# Patient Record
Sex: Female | Born: 1990 | Race: White | Hispanic: No | Marital: Married | Smoking: Never smoker
Health system: Southern US, Community
[De-identification: ages and names within clinical notes are randomized; demographics above are authoritative.]

## PROBLEM LIST (undated history)

## (undated) DIAGNOSIS — R011 Cardiac murmur, unspecified: Secondary | ICD-10-CM

## (undated) DIAGNOSIS — Z8619 Personal history of other infectious and parasitic diseases: Secondary | ICD-10-CM

## (undated) DIAGNOSIS — K5909 Other constipation: Secondary | ICD-10-CM

## (undated) DIAGNOSIS — R55 Syncope and collapse: Secondary | ICD-10-CM

## (undated) DIAGNOSIS — R87629 Unspecified abnormal cytological findings in specimens from vagina: Secondary | ICD-10-CM

## (undated) DIAGNOSIS — K589 Irritable bowel syndrome without diarrhea: Secondary | ICD-10-CM

## (undated) HISTORY — DX: Irritable bowel syndrome, unspecified: K58.9

## (undated) HISTORY — PX: WISDOM TOOTH EXTRACTION: SHX21

## (undated) HISTORY — DX: Syncope and collapse: R55

## (undated) HISTORY — DX: Personal history of other infectious and parasitic diseases: Z86.19

## (undated) HISTORY — DX: Other constipation: K59.09

---
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
## 2018-07-15 LAB — HM PAP SMEAR: HM Pap smear: ABNORMAL

## 2018-10-20 ENCOUNTER — Ambulatory Visit: Payer: Self-pay | Admitting: Physician Assistant

## 2019-04-22 ENCOUNTER — Emergency Department: Payer: Federal, State, Local not specified - PPO

## 2019-04-22 ENCOUNTER — Encounter: Payer: Self-pay | Admitting: Emergency Medicine

## 2019-04-22 ENCOUNTER — Other Ambulatory Visit: Payer: Self-pay

## 2019-04-22 ENCOUNTER — Emergency Department
Admission: EM | Admit: 2019-04-22 | Discharge: 2019-04-22 | Disposition: A | Discharge status: Home / Self Care | Payer: Federal, State, Local not specified - PPO | Attending: Emergency Medicine | Admitting: Emergency Medicine

## 2019-04-22 DIAGNOSIS — R1013 Epigastric pain: Secondary | ICD-10-CM

## 2019-04-22 DIAGNOSIS — R1011 Right upper quadrant pain: Secondary | ICD-10-CM | POA: Diagnosis not present

## 2019-04-22 DIAGNOSIS — Z3202 Encounter for pregnancy test, result negative: Secondary | ICD-10-CM | POA: Insufficient documentation

## 2019-04-22 LAB — COMPREHENSIVE METABOLIC PANEL
ALT: 15 U/L (ref 0–44)
AST: 16 U/L (ref 15–41)
Albumin: 5.1 g/dL — ABNORMAL HIGH (ref 3.5–5.0)
Alkaline Phosphatase: 72 U/L (ref 38–126)
Anion gap: 8 (ref 5–15)
BUN: 11 mg/dL (ref 6–20)
CO2: 28 mmol/L (ref 22–32)
Calcium: 9.4 mg/dL (ref 8.9–10.3)
Chloride: 105 mmol/L (ref 98–111)
Creatinine, Ser: 0.81 mg/dL (ref 0.44–1.00)
GFR calc Af Amer: >60 mL/min (ref >60)
GFR calc non Af Amer: >60 mL/min (ref >60)
Glucose, Bld: 96 mg/dL (ref 70–99)
Potassium: 3.8 mmol/L (ref 3.5–5.1)
Sodium: 141 mmol/L (ref 135–145)
Total Bilirubin: 1.2 mg/dL (ref 0.3–1.2)
Total Protein: 7.6 g/dL (ref 6.5–8.1)

## 2019-04-22 LAB — URINALYSIS, COMPLETE (UACMP) WITH MICROSCOPIC
Bacteria, UA: NONE SEEN
Bilirubin Urine: NEGATIVE
Glucose, UA: NEGATIVE mg/dL
Hgb urine dipstick: NEGATIVE
Ketones, ur: NEGATIVE mg/dL
Leukocytes,Ua: NEGATIVE
Nitrite: NEGATIVE
Protein, ur: NEGATIVE mg/dL
Specific Gravity, Urine: 1.009 (ref 1.005–1.030)
pH: 6 (ref 5.0–8.0)

## 2019-04-22 LAB — CBC
HCT: 40.7 % (ref 36.0–46.0)
Hemoglobin: 13.6 g/dL (ref 12.0–15.0)
MCH: 31.6 pg (ref 26.0–34.0)
MCHC: 33.4 g/dL (ref 30.0–36.0)
MCV: 94.7 fL (ref 80.0–100.0)
Platelets: 280 10*3/uL (ref 150–400)
RBC: 4.3 MIL/uL (ref 3.87–5.11)
RDW: 12.8 % (ref 11.5–15.5)
WBC: 5.8 10*3/uL (ref 4.0–10.5)
nRBC: 0 % (ref 0.0–0.2)

## 2019-04-22 LAB — LIPASE, BLOOD: Lipase: 25 U/L (ref 11–51)

## 2019-04-22 LAB — POCT PREGNANCY, URINE: Preg Test, Ur: NEGATIVE

## 2019-04-22 MED ORDER — ALUM & MAG HYDROXIDE-SIMETH 200-200-20 MG/5ML PO SUSP
30.0000 mL | Freq: Once | ORAL | Status: AC
  Administered 2019-04-22: 30 mL via ORAL
  Filled 2019-04-22: qty 30

## 2019-04-22 MED ORDER — ONDANSETRON HCL 4 MG/2ML IJ SOLN
4.0000 mg | INTRAMUSCULAR | Status: AC
  Administered 2019-04-22: 4 mg via INTRAVENOUS
  Filled 2019-04-22: qty 2

## 2019-04-22 MED ORDER — FAMOTIDINE 20 MG PO TABS: 20.0000 mg | ORAL_TABLET | Freq: Two times a day (BID) | ORAL | 1 refills | Status: DC

## 2019-04-22 MED ORDER — KETOROLAC TROMETHAMINE 30 MG/ML IJ SOLN
15.0000 mg | Freq: Once | INTRAMUSCULAR | Status: AC
  Administered 2019-04-22: 15 mg via INTRAVENOUS
  Filled 2019-04-22: qty 1

## 2019-04-22 MED ORDER — MORPHINE SULFATE (PF) 4 MG/ML IV SOLN
4.0000 mg | Freq: Once | INTRAVENOUS | Status: AC
  Administered 2019-04-22: 4 mg via INTRAVENOUS
  Filled 2019-04-22: qty 1

## 2019-04-22 MED ORDER — LIDOCAINE VISCOUS HCL 2 % MT SOLN
15.0000 mL | Freq: Once | OROMUCOSAL | Status: AC
  Administered 2019-04-22: 08:00:00 15 mL via ORAL
  Filled 2019-04-22: qty 15

## 2019-04-22 MED ORDER — SUCRALFATE 1 G PO TABS: 1.0000 g | ORAL_TABLET | Freq: Four times a day (QID) | ORAL | 1 refills | Status: DC

## 2019-04-22 NOTE — ED Notes (Signed)
Dr Williams at bedside 

## 2019-04-22 NOTE — ED Triage Notes (Signed)
Patient to ER for mid to upper abd pain with radiating through to back. +Chills. Denies known fever, N/V/D. Patient's last BM was yesterday. Patient states pain is better when bending over.

## 2019-04-22 NOTE — ED Provider Notes (Addendum)
[facility] Emergency Department Provider Note  ____________________________________________   First MD Initiated Contact with Patient 04/22/19 [phone]     (approximate)  I have reviewed the triage vital signs and the nursing notes.   HISTORY  Chief Complaint Abdominal Pain    HPI Monica PatienceStephanie Gahm is a 28 y.o. female with no contributory past medical history who presents for evaluation of acute onset and severe epigastric pain radiating through to her back.  She has not had any nausea or vomiting but feels gassy.  The pain woke her up from sleep at about 4:00 AM and has been persistent and severe and feels both sharp and aching.  Nothing in particular makes her feel better except for leaning over slightly.  Nothing in particular makes it worse.  She last ate a meal at 8:00 PM.  She has no chest pain or shortness of breath.  No contact with COVID-19 patients.  She denies fever but had some chills while she was suffering from the worst of the pain.  She denies sore throat, shortness of breath, cough, nausea, vomiting, lower abdominal pain, and dysuria.  She does not believe she is pregnant but has had irregular periods since having her baby 10 months ago.         History reviewed. No pertinent past medical history.  There are no active problems to display for this patient.   History reviewed. No pertinent surgical history.  Prior to Admission medications   Not on File    Allergies Patient has no known allergies.  No family history on file.  Social History Social History   Tobacco Use   Smoking status: Never Smoker   Smokeless tobacco: Never Used  Substance Use Topics   Alcohol use: Never    Frequency: Never   Drug use: Not on file    Review of Systems Constitutional: No fever, subjective chills with the pain. Eyes: No visual changes. ENT: No sore throat. Cardiovascular: Denies chest pain. Respiratory: Denies shortness of  breath. Gastrointestinal: Severe epigastric abdominal pain rating through to the back.  Patient feels "gassy" but without nausea or vomiting.  No diarrhea nor constipation. Genitourinary: Negative for dysuria. Musculoskeletal: Negative for neck pain.  Negative for back pain. Integumentary: Negative for rash. Neurological: Negative for headaches, focal weakness or numbness.   ____________________________________________   PHYSICAL EXAM:  VITAL SIGNS: ED Triage Vitals  Enc Vitals Group     BP 04/22/19 0531 117/75     Pulse Rate 04/22/19 0531 82     Resp 04/22/19 0531 17     Temp 04/22/19 0531 97.8 F (36.6 C)     Temp Source 04/22/19 0531 Oral     SpO2 04/22/19 0531 99 %     Weight 04/22/19 0525 53.1 kg (117 lb)     Height 04/22/19 0525 1.702 m (5\' 7" )     Head Circumference --      Peak Flow --      Pain Score 04/22/19 0525 5     Pain Loc --      Pain Edu? --      Excl. in GC? --     Constitutional: Alert and oriented.  Appears uncomfortable, frequently grimaces with pain. Eyes: Conjunctivae are normal.  Head: Atraumatic. Nose: No congestion/rhinnorhea. Mouth/Throat: Mucous membranes are moist. Neck: No stridor.  No meningeal signs.   Cardiovascular: Normal rate, regular rhythm. Good peripheral circulation. Grossly normal heart sounds. Respiratory: Normal respiratory effort.  No retractions. Gastrointestinal: Soft and  nondistended.  Tender to palpation in the epigastrium, some tenderness in the right upper quadrant with equivocal Murphy sign.  No rebound or guarding, no lower abdominal tenderness.  No abdominal bruit or pulsatile masses. Musculoskeletal: No lower extremity tenderness nor edema. No gross deformities of extremities. Neurologic:  Normal speech and language. No gross focal neurologic deficits are appreciated.  Skin:  Skin is warm, dry and intact. Psychiatric: Mood and affect are normal. Speech and behavior are  normal.  ____________________________________________   LABS (all labs ordered are listed, but only abnormal results are displayed)  Labs Reviewed  CBC  LIPASE, BLOOD  COMPREHENSIVE METABOLIC PANEL  URINALYSIS, COMPLETE (UACMP) WITH MICROSCOPIC  POC URINE PREG, ED   ____________________________________________  EKG  ED ECG REPORT I, Hinda Kehr, the attending physician, personally viewed and interpreted this ECG.  Date: 04/22/2019 EKG Time: 05:28 Rate: 90 Rhythm: normal sinus rhythm QRS Axis: normal Intervals: normal ST/T Wave abnormalities: non-specific T waves changes including inverted waves in III and aVF Narrative Interpretation: no definitive evidence of acute ischemia  ____________________________________________  RADIOLOGY I, Hinda Kehr, personally viewed and evaluated these images (plain radiographs) as part of my medical decision making, as well as reviewing the written report by the radiologist.  ED MD interpretation:  U/S pending at time of sign-out  Official radiology report(s): No results found.  ____________________________________________   PROCEDURES   Procedure(s) performed (including Critical Care):  Procedures   ____________________________________________   INITIAL IMPRESSION / MDM / [place] / ED COURSE  As part of my medical decision making, I reviewed the following data within the [place] notes reviewed and incorporated, Labs reviewed , Old chart reviewed, Patient signed out to Dr. Jimmye Norman, Notes from prior ED visits and [place] Controlled Substance Database   Differential diagnosis includes, but is not limited to, biliary colic (cholelithiasis versus cholecystitis), gastritis/GERD, aortic dissection.  The patient's vital signs are stable and she is afebrile.  She is obviously uncomfortable and I am ordering morphine 4 mg IV and Zofran 4 mg IV as well as Toradol 15 mg IV.  I strongly suspect  symptomatic cholelithiasis.  Lab work is pending and I have ordered the right upper quadrant ultrasound.  She is hemodynamically stable.  She understands and agrees with the plan and will remain n.p.o.      Clinical Course as of Apr 22 655  Wed Apr 22, 2019  0651 Normal CBC without leukocytosis  CBC [CF]  9191 Transferring ED care to Dr. Jimmye Norman at 7:00am to follow up CMP and lipase, U-preg, and ultrasound results.   [CF]    Clinical Course User Index [CF] Hinda Kehr, MD     ____________________________________________  FINAL CLINICAL IMPRESSION(S) / ED DIAGNOSES  Final diagnoses:  Epigastric pain     MEDICATIONS GIVEN DURING THIS VISIT:  Medications  morphine 4 MG/ML injection 4 mg (4 mg Intravenous Given 04/22/19 0621)  ondansetron (ZOFRAN) injection 4 mg (4 mg Intravenous Given 04/22/19 0621)  ketorolac (TORADOL) 30 MG/ML injection 15 mg (15 mg Intravenous Given 04/22/19 0617)     ED Discharge Orders    None      *Please note:  Prabhnoor Ellenberger was evaluated in Emergency Department on 04/22/2019 for the symptoms described in the history of present illness. She was evaluated in the context of the global COVID-19 pandemic, which necessitated consideration that the patient might be at risk for infection with the SARS-CoV-2 virus that causes COVID-19. Institutional protocols and algorithms that pertain  to the evaluation of patients at risk for COVID-19 are in a state of rapid change based on information released by regulatory bodies including the CDC and federal and state organizations. These policies and algorithms were followed during the patient's care in the ED.  Some ED evaluations and interventions may be delayed as a result of limited staffing during the pandemic.*  Note:  This document was prepared using Dragon voice recognition software and may include unintentional dictation errors.   Loleta Rose, MD 04/22/19 4585    Loleta Rose, MD 04/28/19 [phone]

## 2019-04-22 NOTE — ED Notes (Signed)
Ultrasound at bedside

## 2019-04-22 NOTE — ED Provider Notes (Signed)
Patient with epigastric pain which is likely GERD or peptic ulcer related.  She was given a GI cocktail, I will place her on antacids and advise close outpatient follow-up with her doctor.   Earleen Newport, MD 04/22/19 [phone]

## 2019-04-27 DIAGNOSIS — K769 Liver disease, unspecified: Secondary | ICD-10-CM | POA: Insufficient documentation

## 2019-07-15 LAB — HM PAP SMEAR: HM Pap smear: ABNORMAL

## 2019-08-12 ENCOUNTER — Encounter: Payer: Self-pay | Admitting: Family Medicine

## 2019-08-12 ENCOUNTER — Other Ambulatory Visit: Payer: Self-pay

## 2019-08-12 ENCOUNTER — Ambulatory Visit: Payer: Federal, State, Local not specified - PPO | Admitting: Family Medicine

## 2019-08-12 VITALS — BP 126/74 | HR 85 | Temp 98.3°F | Resp 16 | Ht 67.0 in | Wt 120.8 lb

## 2019-08-12 DIAGNOSIS — K769 Liver disease, unspecified: Secondary | ICD-10-CM

## 2019-08-12 DIAGNOSIS — Z803 Family history of malignant neoplasm of breast: Secondary | ICD-10-CM

## 2019-08-12 DIAGNOSIS — R87619 Unspecified abnormal cytological findings in specimens from cervix uteri: Secondary | ICD-10-CM | POA: Diagnosis not present

## 2019-08-12 DIAGNOSIS — R55 Syncope and collapse: Secondary | ICD-10-CM

## 2019-08-12 NOTE — Patient Instructions (Signed)
Great to meet you! 

## 2019-08-12 NOTE — Assessment & Plan Note (Signed)
Hx of ASCUS but unable to find most recent. Already scheduled for Colpo and follows with GYN

## 2019-08-12 NOTE — Assessment & Plan Note (Signed)
Reviewed EKG and provided reassurance. Advised monitoring symptoms and if worsening would refer to cardiology for more work-up

## 2019-08-12 NOTE — Assessment & Plan Note (Signed)
Advised clarifying what age her grandmother was diagnosed with Breast cancer. Could discuss screening starting 10 years before grandmother's diagnosis.

## 2019-08-12 NOTE — Assessment & Plan Note (Signed)
Per recommendation, repeat US in 6 months. Ordered today. Symptoms from ER resolved

## 2019-08-12 NOTE — Progress Notes (Signed)
Subjective:     Monica Bowers is a 28 y.o. female presenting for Establish Care (no previous PCP but does see GYN at [facility]), Discuss recent EKG, Liver lesions, and Mammogram (wanted to ask due to family history of breast cancer)     HPI   #Liver Lesion - found during ER visit on 04/22/2019  #Abnormal PAP - due for Colpo - in 1 year - second abnormal - ASCUS  #Abnormal EKG - during pregnancy had dizziness - endorses some blacking out and dizzy spells - triggered by hot rooms, standing for long periods of time - endorses some SOB with these episodes - no palpitations, no chest pain - occur 2-3 times a year - will check BP - and her BP will drop and her HR will race during episodes  #Breast Cancer Screening - parents got genetic testing done and was negative - not sure exactly what it was and it was  Review of Systems  Constitutional: Negative for chills and fever.  Respiratory: Negative for shortness of breath.   Cardiovascular: Negative for chest pain and palpitations.     Social History   Tobacco Use  Smoking Status Never Smoker  Smokeless Tobacco Never Used        Objective:    BP Readings from Last 3 Encounters:  08/12/19 126/74  04/22/19 118/75   Wt Readings from Last 3 Encounters:  08/12/19 120 lb 12 oz (54.8 kg)  04/22/19 117 lb (53.1 kg)    BP 126/74   Pulse 85   Temp 98.3 F (36.8 C)   Resp 16   Ht 5\' 7"  (1.702 m)   Wt 120 lb 12 oz (54.8 kg)   LMP 08/01/2019   SpO2 99%   BMI 18.91 kg/m    Physical Exam Constitutional:      General: She is not in acute distress.    Appearance: She is well-developed. She is not diaphoretic.  HENT:     Right Ear: External ear normal.     Left Ear: External ear normal.     Nose: Nose normal.  Eyes:     Conjunctiva/sclera: Conjunctivae normal.  Cardiovascular:     Rate and Rhythm: Normal rate and regular rhythm.     Heart sounds: No murmur.  Pulmonary:     Effort:  Pulmonary effort is normal. No respiratory distress.     Breath sounds: Normal breath sounds. No wheezing.  Musculoskeletal:     Cervical back: Neck supple.  Skin:    General: Skin is warm and dry.     Capillary Refill: Capillary refill takes less than 2 seconds.  Neurological:     Mental Status: She is alert. Mental status is at baseline.  Psychiatric:        Mood and Affect: Mood normal.        Behavior: Behavior normal.    EKG: NSR, some inverted t-waves in V4-V6 but only appears in 1-2 beats       Assessment & Plan:   Problem List Items Addressed This Visit      Other   Liver lesion, right lobe - Primary    Per recommendation, repeat US in 6 months. Ordered today. Symptoms from ER resolved      Relevant Orders   US Abdomen Limited RUQ   Abnormal cervical Papanicolaou smear    Hx of ASCUS but unable to find most recent. Already scheduled for Colpo and follows with GYN      Vasovagal near-syncope  Reviewed EKG and provided reassurance. Advised monitoring symptoms and if worsening would refer to cardiology for more work-up      Family history of breast cancer    Advised clarifying what age her grandmother was diagnosed with Breast cancer. Could discuss screening starting 10 years before grandmother's diagnosis.           Return in about 1 year (around 08/11/2020).  Lynnda Child, MD

## 2019-09-14 ENCOUNTER — Other Ambulatory Visit: Payer: Self-pay

## 2019-09-14 ENCOUNTER — Encounter: Payer: Self-pay | Admitting: Family Medicine

## 2019-09-14 ENCOUNTER — Ambulatory Visit
Admission: RE | Admit: 2019-09-14 | Discharge: 2019-09-14 | Disposition: A | Discharge status: Home / Self Care | Payer: Federal, State, Local not specified - PPO | Source: Ambulatory Visit | Attending: Family Medicine | Admitting: Family Medicine

## 2019-09-14 DIAGNOSIS — K769 Liver disease, unspecified: Secondary | ICD-10-CM | POA: Insufficient documentation

## 2019-11-23 ENCOUNTER — Ambulatory Visit: Payer: Federal, State, Local not specified - PPO

## 2019-12-07 ENCOUNTER — Ambulatory Visit: Payer: Federal, State, Local not specified - PPO | Attending: Internal Medicine

## 2019-12-07 DIAGNOSIS — Z23 Encounter for immunization: Secondary | ICD-10-CM

## 2019-12-07 NOTE — Progress Notes (Signed)
   Covid-19 Vaccination Clinic  Name:  Monica Bowers    MRN: [id number] DOB: 1990-09-15  12/07/2019  [name] was observed post Covid-19 immunization for 30 minutes based on pre-vaccination screening without incident. She was provided with Vaccine Information Sheet and instruction to access the V-Safe system.   [name] was instructed to call 911 with any severe reactions post vaccine: Marland Kitchen Difficulty breathing  . Swelling of face and throat  . A fast heartbeat  . A bad rash all over body  . Dizziness and weakness   Immunizations Administered    Name Date Dose VIS Date Route   Pfizer COVID-19 Vaccine 12/07/2019 10:35 AM 0.3 mL 10/14/2018 Intramuscular   Manufacturer: [employer], [employer]   Lot: [id number]   NDC: [address]-7

## 2019-12-30 ENCOUNTER — Ambulatory Visit: Payer: Federal, State, Local not specified - PPO | Attending: Internal Medicine

## 2019-12-30 DIAGNOSIS — Z23 Encounter for immunization: Secondary | ICD-10-CM

## 2019-12-30 NOTE — Progress Notes (Signed)
   Covid-19 Vaccination Clinic  Name:  Debi Cousin    MRN: [id number] DOB: 1990/11/22  12/30/2019  Ms. Weninger was observed post Covid-19 immunization for 15 minutes without incident. She was provided with Vaccine Information Sheet and instruction to access the V-Safe system.   Ms. Wires was instructed to call 911 with any severe reactions post vaccine: Marland Kitchen Difficulty breathing  . Swelling of face and throat  . A fast heartbeat  . A bad rash all over body  . Dizziness and weakness   Immunizations Administered    Name Date Dose VIS Date Route   Pfizer COVID-19 Vaccine 12/30/2019 11:00 AM 0.3 mL 10/14/2018 Intramuscular   Manufacturer: [employer], [employer]   Lot: [id number]   NDC: [address]-5

## 2020-01-05 ENCOUNTER — Encounter: Payer: Self-pay | Admitting: Family Medicine

## 2020-01-05 DIAGNOSIS — R55 Syncope and collapse: Secondary | ICD-10-CM

## 2020-01-06 NOTE — Telephone Encounter (Signed)
Dr Selena Batten wanted to make sure that patient needs an appointment first.

## 2020-01-07 ENCOUNTER — Other Ambulatory Visit: Payer: Self-pay

## 2020-01-07 ENCOUNTER — Ambulatory Visit: Payer: Federal, State, Local not specified - PPO | Admitting: Family Medicine

## 2020-01-07 ENCOUNTER — Encounter: Payer: Self-pay | Admitting: Family Medicine

## 2020-01-07 VITALS — BP 124/78 | HR 75 | Temp 98.5°F | Resp 14 | Ht 67.0 in | Wt 122.2 lb

## 2020-01-07 DIAGNOSIS — Z8249 Family history of ischemic heart disease and other diseases of the circulatory system: Secondary | ICD-10-CM

## 2020-01-07 DIAGNOSIS — R55 Syncope and collapse: Secondary | ICD-10-CM

## 2020-01-07 DIAGNOSIS — R079 Chest pain, unspecified: Secondary | ICD-10-CM | POA: Diagnosis not present

## 2020-01-07 DIAGNOSIS — Z87898 Personal history of other specified conditions: Secondary | ICD-10-CM | POA: Diagnosis not present

## 2020-01-07 NOTE — Assessment & Plan Note (Signed)
Occurred during pregnancy and patient planning another pregnancy and would like to discuss with cardiology.

## 2020-01-07 NOTE — Progress Notes (Signed)
Subjective:     Monica Bowers is a 29 y.o. female presenting for Issues after COVID vaccine (had COVID vaccine #2 on 5/12 and on 5/15 started to develop chest pain/tightness, left arm tingly and then mild neck pain and tinnitus on the left side.)     HPI   #Chest pain - in the left breast area and radiates to the side of neck and arm - tingling all the way down the left arm - location of covid vaccine - left arm - no issues after the first shot - symptoms are gone at night but return every morning - worse with using upper body strength - picking up baby - not worse with walking or going upstairs  - dizzy episodes with prolonged standing - no palpations - no swelling   Review of Systems   Social History   Tobacco Use  Smoking Status Never Smoker  Smokeless Tobacco Never Used        Objective:    BP Readings from Last 3 Encounters:  01/07/20 124/78  08/12/19 126/74  04/22/19 118/75   Wt Readings from Last 3 Encounters:  01/07/20 122 lb 4 oz (55.5 kg)  08/12/19 120 lb 12 oz (54.8 kg)  04/22/19 117 lb (53.1 kg)    BP 124/78   Pulse 75   Temp 98.5 F (36.9 C)   Resp 14   Ht 5\' 7"  (1.702 m)   Wt 122 lb 4 oz (55.5 kg)   LMP 01/01/2020   SpO2 100%   BMI 19.15 kg/m    Physical Exam Constitutional:      General: She is not in acute distress.    Appearance: She is well-developed. She is not diaphoretic.  HENT:     Head: Normocephalic and atraumatic.     Right Ear: Tympanic membrane and external ear normal.     Left Ear: Tympanic membrane and external ear normal.     Nose: Nose normal.     Mouth/Throat:     Mouth: Mucous membranes are moist.     Pharynx: No posterior oropharyngeal erythema.  Eyes:     Conjunctiva/sclera: Conjunctivae normal.  Cardiovascular:     Rate and Rhythm: Normal rate and regular rhythm.     Heart sounds: No murmur.  Pulmonary:     Effort: Pulmonary effort is normal. No respiratory distress.     Breath sounds:  Normal breath sounds. No wheezing.  Musculoskeletal:     Cervical back: Neck supple.  Skin:    General: Skin is warm and dry.     Capillary Refill: Capillary refill takes less than 2 seconds.  Neurological:     Mental Status: She is alert. Mental status is at baseline.  Psychiatric:        Mood and Affect: Mood normal.        Behavior: Behavior normal.    EKG: NSR, no ST changes, no Twave changes       Assessment & Plan:   Problem List Items Addressed This Visit      Other   Vasovagal near-syncope    Occurred during pregnancy and patient planning another pregnancy and would like to discuss with cardiology.       Family history of heart attack    Other Visit Diagnoses    Chest pain, unspecified type    -  Primary   Relevant Orders   EKG 12-Lead (Completed)   History of syncope         Discussed that symptoms  seem most consistent with MSK related - reproducible with lifting arm but not to touch. EKG done given mother's recent MI and hx of syncope and lack of reproduction with palpation which was reassuring. Already has cardiology appointment to discuss syncope and will defer lab work to them. Previous blood work 04/2019 was reassuring.    Return if symptoms worsen or fail to improve.  Lynnda Child, MD

## 2020-01-07 NOTE — Patient Instructions (Signed)
EKG looks normal  I think your pain and tingling are likely some muscle inflammation since the vaccine - continue to watch and wait - can try tylenol or ibuprofen - see Cardiology for the dizziness   Tinnitus - would also watch and wait - MyChart in 2-3 weeks if not resolved and we can have you see ENT specialist

## 2020-02-19 ENCOUNTER — Encounter: Payer: Self-pay | Admitting: Family Medicine

## 2020-02-19 DIAGNOSIS — K769 Liver disease, unspecified: Secondary | ICD-10-CM

## 2020-02-24 ENCOUNTER — Ambulatory Visit
Admission: RE | Admit: 2020-02-24 | Discharge: 2020-02-24 | Disposition: A | Discharge status: Home / Self Care | Payer: Federal, State, Local not specified - PPO | Source: Ambulatory Visit | Attending: Family Medicine | Admitting: Family Medicine

## 2020-02-24 ENCOUNTER — Other Ambulatory Visit: Payer: Self-pay

## 2020-02-24 DIAGNOSIS — K769 Liver disease, unspecified: Secondary | ICD-10-CM | POA: Diagnosis present

## 2020-04-13 LAB — OB RESULTS CONSOLE RPR: RPR: NONREACTIVE

## 2020-04-13 LAB — OB RESULTS CONSOLE HIV ANTIBODY (ROUTINE TESTING): HIV: NONREACTIVE

## 2020-04-13 LAB — OB RESULTS CONSOLE HEPATITIS B SURFACE ANTIGEN: Hepatitis B Surface Ag: NEGATIVE

## 2020-04-13 LAB — OB RESULTS CONSOLE ABO/RH: RH Type: POSITIVE

## 2020-04-13 LAB — OB RESULTS CONSOLE ANTIBODY SCREEN: Antibody Screen: NEGATIVE

## 2020-04-13 LAB — OB RESULTS CONSOLE RUBELLA ANTIBODY, IGM: Rubella: NON-IMMUNE/NOT IMMUNE

## 2020-04-13 LAB — OB RESULTS CONSOLE GC/CHLAMYDIA
Chlamydia: NEGATIVE
Gonorrhea: NEGATIVE

## 2020-08-20 NOTE — L&D Delivery Note (Signed)
Delivery Note At 3:01 AM a viable female was delivered via Vaginal, Spontaneous (Presentation: vtx, LOA ).  APGAR: 9, 9; weight pending.   Placenta status: Spontaneous, Intact.  Cord: 3 vessels with the following complications: short cord.   Anesthesia: Epidural Episiotomy: None Lacerations: None Suture Repair: none Est. Blood Loss (mL): 100  Mom to postpartum.  Baby to Couplet care / Skin to Skin.  Leighton Roach Meisinger 11/04/2020, 3:12 AM

## 2020-10-12 LAB — OB RESULTS CONSOLE GBS: GBS: NEGATIVE

## 2020-10-31 ENCOUNTER — Telehealth (HOSPITAL_COMMUNITY): Payer: Self-pay | Admitting: *Deleted

## 2020-10-31 ENCOUNTER — Encounter (HOSPITAL_COMMUNITY): Payer: Self-pay | Admitting: *Deleted

## 2020-10-31 NOTE — Telephone Encounter (Signed)
Preadmission screen  

## 2020-11-01 ENCOUNTER — Telehealth (HOSPITAL_COMMUNITY): Payer: Self-pay | Admitting: *Deleted

## 2020-11-01 ENCOUNTER — Encounter (HOSPITAL_COMMUNITY): Payer: Self-pay | Admitting: *Deleted

## 2020-11-01 NOTE — Telephone Encounter (Signed)
Preadmission screen  

## 2020-11-03 ENCOUNTER — Inpatient Hospital Stay (HOSPITAL_COMMUNITY)
Admission: AD | Admit: 2020-11-03 | Discharge: 2020-11-05 | DRG: 807 | Disposition: A | Discharge status: Home / Self Care | Payer: 59 | Attending: Obstetrics and Gynecology | Admitting: Obstetrics and Gynecology

## 2020-11-03 ENCOUNTER — Encounter (HOSPITAL_COMMUNITY): Payer: Self-pay | Admitting: Obstetrics and Gynecology

## 2020-11-03 ENCOUNTER — Inpatient Hospital Stay (HOSPITAL_COMMUNITY): Payer: 59 | Admitting: Anesthesiology

## 2020-11-03 ENCOUNTER — Other Ambulatory Visit: Payer: Self-pay

## 2020-11-03 DIAGNOSIS — Z3A39 39 weeks gestation of pregnancy: Secondary | ICD-10-CM

## 2020-11-03 DIAGNOSIS — Z20822 Contact with and (suspected) exposure to covid-19: Secondary | ICD-10-CM | POA: Diagnosis present

## 2020-11-03 DIAGNOSIS — O368130 Decreased fetal movements, third trimester, not applicable or unspecified: Secondary | ICD-10-CM | POA: Diagnosis present

## 2020-11-03 LAB — CBC
HCT: 33 % — ABNORMAL LOW (ref 36.0–46.0)
Hemoglobin: 10.1 g/dL — ABNORMAL LOW (ref 12.0–15.0)
MCH: 27.4 pg (ref 26.0–34.0)
MCHC: 30.6 g/dL (ref 30.0–36.0)
MCV: 89.4 fL (ref 80.0–100.0)
Platelets: 356 10*3/uL (ref 150–400)
RBC: 3.69 MIL/uL — ABNORMAL LOW (ref 3.87–5.11)
RDW: 15.6 % — ABNORMAL HIGH (ref 11.5–15.5)
WBC: 11 10*3/uL — ABNORMAL HIGH (ref 4.0–10.5)
nRBC: 0 % (ref 0.0–0.2)

## 2020-11-03 LAB — RESP PANEL BY RT-PCR (FLU A&B, COVID) ARPGX2
Influenza A by PCR: NEGATIVE
Influenza B by PCR: NEGATIVE
SARS Coronavirus 2 by RT PCR: NEGATIVE

## 2020-11-03 LAB — TYPE AND SCREEN
ABO/RH(D): A POS
Antibody Screen: NEGATIVE

## 2020-11-03 MED ORDER — OXYTOCIN BOLUS FROM INFUSION
333.0000 mL | Freq: Once | INTRAVENOUS | Status: AC
  Administered 2020-11-04: 333 mL via INTRAVENOUS

## 2020-11-03 MED ORDER — OXYCODONE-ACETAMINOPHEN 5-325 MG PO TABS: 1.0000 | ORAL_TABLET | ORAL | Status: DC | PRN

## 2020-11-03 MED ORDER — DIPHENHYDRAMINE HCL 50 MG/ML IJ SOLN: 12.5000 mg | INTRAMUSCULAR | Status: DC | PRN

## 2020-11-03 MED ORDER — FENTANYL-BUPIVACAINE-NACL 0.5-0.125-0.9 MG/250ML-% EP SOLN
12.0000 mL/h | EPIDURAL | Status: DC | PRN
  Administered 2020-11-03: 12 mL/h via EPIDURAL
  Filled 2020-11-03: qty 250

## 2020-11-03 MED ORDER — LACTATED RINGERS IV SOLN
500.0000 mL | INTRAVENOUS | Status: DC | PRN
  Administered 2020-11-03: 500 mL via INTRAVENOUS

## 2020-11-03 MED ORDER — SOD CITRATE-CITRIC ACID 500-334 MG/5ML PO SOLN: 30.0000 mL | ORAL | Status: DC | PRN

## 2020-11-03 MED ORDER — PHENYLEPHRINE 40 MCG/ML (10ML) SYRINGE FOR IV PUSH (FOR BLOOD PRESSURE SUPPORT)
80.0000 ug | PREFILLED_SYRINGE | INTRAVENOUS | Status: DC | PRN
  Filled 2020-11-03: qty 10

## 2020-11-03 MED ORDER — OXYCODONE-ACETAMINOPHEN 5-325 MG PO TABS: 2.0000 | ORAL_TABLET | ORAL | Status: DC | PRN

## 2020-11-03 MED ORDER — LIDOCAINE HCL (PF) 1 % IJ SOLN: 30.0000 mL | INTRAMUSCULAR | Status: DC | PRN

## 2020-11-03 MED ORDER — PHENYLEPHRINE 40 MCG/ML (10ML) SYRINGE FOR IV PUSH (FOR BLOOD PRESSURE SUPPORT): 80.0000 ug | PREFILLED_SYRINGE | INTRAVENOUS | Status: DC | PRN

## 2020-11-03 MED ORDER — EPHEDRINE 5 MG/ML INJ: 10.0000 mg | INTRAVENOUS | Status: DC | PRN

## 2020-11-03 MED ORDER — AMMONIA AROMATIC IN INHA
RESPIRATORY_TRACT | Status: AC
  Filled 2020-11-03: qty 10

## 2020-11-03 MED ORDER — ONDANSETRON HCL 4 MG/2ML IJ SOLN: 4.0000 mg | Freq: Four times a day (QID) | INTRAMUSCULAR | Status: DC | PRN

## 2020-11-03 MED ORDER — ACETAMINOPHEN 325 MG PO TABS: 650.0000 mg | ORAL_TABLET | ORAL | Status: DC | PRN

## 2020-11-03 MED ORDER — OXYTOCIN-SODIUM CHLORIDE 30-0.9 UT/500ML-% IV SOLN: 2.5000 [IU]/h | INTRAVENOUS | Status: DC

## 2020-11-03 MED ORDER — LACTATED RINGERS IV SOLN: INTRAVENOUS | Status: DC

## 2020-11-03 MED ORDER — LACTATED RINGERS IV SOLN
500.0000 mL | Freq: Once | INTRAVENOUS | Status: AC
  Administered 2020-11-03: 500 mL via INTRAVENOUS

## 2020-11-03 MED ORDER — BUTORPHANOL TARTRATE 1 MG/ML IJ SOLN
1.0000 mg | INTRAMUSCULAR | Status: DC | PRN
  Administered 2020-11-03: 1 mg via INTRAVENOUS
  Filled 2020-11-03: qty 1

## 2020-11-03 MED ORDER — LIDOCAINE-EPINEPHRINE (PF) 2 %-1:200000 IJ SOLN
INTRAMUSCULAR | Status: DC | PRN
  Administered 2020-11-03: 4 mL via EPIDURAL

## 2020-11-03 NOTE — MAU Note (Signed)
Pt reports she was seen in the office yesterday and was 1 cm. Pt reports she had her membranes swept.   Pt reports ctx's since yesterday at 1700. Pt reports ctx's every 4 minutes for the last two hours.   Pt reports no fetal movement for the last 4 hours. Pt reports decreased movement since this morning over all.   Denies vaginal bleeding or LOF.

## 2020-11-03 NOTE — Anesthesia Procedure Notes (Signed)
Epidural Patient location during procedure: OB Start time: 11/03/2020 9:35 PM End time: 11/03/2020 9:45 PM  Staffing Anesthesiologist: Elmer Picker, MD Performed: anesthesiologist   Preanesthetic Checklist Completed: patient identified, IV checked, risks and benefits discussed, monitors and equipment checked, pre-op evaluation and timeout performed  Epidural Patient position: sitting Prep: DuraPrep and site prepped and draped Patient monitoring: continuous pulse ox, blood pressure, heart rate and cardiac monitor Approach: midline Location: L3-L4 Injection technique: LOR air  Needle:  Needle type: Tuohy  Needle gauge: 17 G Needle length: 9 cm Needle insertion depth: 4 cm Catheter type: closed end flexible Catheter size: 19 Gauge Catheter at skin depth: 10 cm Test dose: negative  Assessment Sensory level: T8 Events: blood not aspirated, injection not painful, no injection resistance, no paresthesia and negative IV test  Additional Notes Patient identified. Risks/Benefits/Options discussed with patient including but not limited to bleeding, infection, nerve damage, paralysis, failed block, incomplete pain control, headache, blood pressure changes, nausea, vomiting, reactions to medication both or allergic, itching and postpartum back pain. Confirmed with bedside nurse the patient's most recent platelet count. Confirmed with patient that they are not currently taking any anticoagulation, have any bleeding history or any family history of bleeding disorders. Patient expressed understanding and wished to proceed. All questions were answered. Sterile technique was used throughout the entire procedure. Please see nursing notes for vital signs. Test dose was given through epidural catheter and negative prior to continuing to dose epidural or start infusion. Warning signs of high block given to the patient including shortness of breath, tingling/numbness in hands, complete motor block,  or any concerning symptoms with instructions to call for help. Patient was given instructions on fall risk and not to get out of bed. All questions and concerns addressed with instructions to call with any issues or inadequate analgesia.  Reason for block:procedure for pain

## 2020-11-03 NOTE — MAU Provider Note (Signed)
Patient Monica Bowers is a 30 y.o. G2P1  at [redacted]w[redacted]d here with complaints of contractions after she lost her mucous plug this morning. She denies vaginal bleeding, LOF, diabetes, hypertension, preterm labor. She has a history of syncope. She had NSVD in October 2019.   She reports that her baby's movements have been less today.  History     CSN: [id number]  Arrival date and time: 11/03/20 1502   None     Chief Complaint  Patient presents with  . Contractions   HPI  Patient reports that baby has been moving less than normal today. Last movement she was aware of was this morning at 10 am; it was a strong movement.  She reports that she woke up, played with her toddler, took a walk, took a bath, ate lunch. She noticed that her contractions started to increase over the course of the day and then realized "I haven't felt the baby move as much".  She did not try drinking juice, lying on her side and counting movements.   She reports that he normally moves a lot at night, and he moved normally last night.   OB History    Gravida  2   Para  1   Term      Preterm      AB      Living        SAB      IAB      Ectopic      Multiple      Live Births              Past Medical History:  Diagnosis Date  . Chronic constipation   . History of chicken pox   . IBS (irritable bowel syndrome)   . Syncope     Past Surgical History:  Procedure Laterality Date  . WISDOM TOOTH EXTRACTION      Family History  Problem Relation Age of Onset  . Thyroid cancer Mother   . Diabetes Mother   . Hyperlipidemia Mother   . Hypertension Mother   . Miscarriages / [place] Mother   . Asthma Father   . Pancreatic cancer Father   . Stroke Father   . Diabetes Maternal Grandmother   . Hypertension Maternal Grandmother   . Hyperlipidemia Maternal Grandfather   . Heart disease Maternal Grandfather   . Heart attack Maternal Grandfather 50       in his 55s  . Hearing loss  Maternal Grandfather   . Breast cancer Paternal Grandmother 46  . Diabetes Paternal Grandmother   . Hearing loss Paternal Grandmother   . Kidney disease Paternal Grandmother   . Heart disease Paternal Grandfather   . Hyperlipidemia Paternal Grandfather     Social History   Tobacco Use  . Smoking status: Never Smoker  . Smokeless tobacco: Never Used  Vaping Use  . Vaping Use: Never used  Substance Use Topics  . Alcohol use: Never  . Drug use: Never    Allergies: No Known Allergies  Medications Prior to Admission  Medication Sig Dispense Refill Last Dose  . Multiple Vitamin (MULTIVITAMIN) tablet Take 1 tablet by mouth daily.       Review of Systems  Constitutional: Negative.   HENT: Negative.   Respiratory: Negative.   Cardiovascular: Negative.   Gastrointestinal: Negative.   Genitourinary: Negative.    Physical Exam   Temperature 98.5 F (36.9 C), resp. rate 16, last menstrual period 01/01/2020, SpO2 100 %.  Physical Exam  Constitutional:      Appearance: Normal appearance.  HENT:     Head: Normocephalic.  Cardiovascular:     Rate and Rhythm: Normal rate.     Pulses: Normal pulses.  Pulmonary:     Effort: Pulmonary effort is normal.  Abdominal:     General: Abdomen is flat.  Neurological:     Mental Status: She is alert.     MAU Course  Procedures  MDM Patient reports some movements while in MAU. Patient had NST with baseline of 150, mod var, present acel, no decels, contractions  q 2-3 minutes. At 1700 she began to have late decelerations, and still only felt 7 movements in 2 hours.  Discussed with Dr. Jackelyn Knife and will admit for induction.  1730: RN at the bedside to start IV, explained plan of care to patient who agrees with IOL due to NRFHT and decreased fetal movements.   Assessment and Plan  Dr. Jackelyn Knife assumes care of this patient at 23.   Monica Bowers 11/03/2020, 3:49 PM

## 2020-11-03 NOTE — H&P (Signed)
Monica Bowers is a 30 y.o. female, G2 P1001, EGA [redacted] weeks with EDC 3-24 presenting for ctx and decreased fetal movement.  On eval in MAU, irreg but strong ctx, VE with no significant change from last exam, FHT reactive.  PNC essentially uncomplicated.  OB History    Gravida  2   Para  1   Term      Preterm      AB      Living        SAB      IAB      Ectopic      Multiple      Live Births             Past Medical History:  Diagnosis Date  . Chronic constipation   . History of chicken pox   . IBS (irritable bowel syndrome)   . Syncope    Past Surgical History:  Procedure Laterality Date  . WISDOM TOOTH EXTRACTION     Family History: family history includes Asthma in her father; Breast cancer (age of onset: 29) in her paternal grandmother; Diabetes in her maternal grandmother, mother, and paternal grandmother; Hearing loss in her maternal grandfather and paternal grandmother; Heart attack (age of onset: 47) in her maternal grandfather; Heart disease in her maternal grandfather and paternal grandfather; Hyperlipidemia in her maternal grandfather, mother, and paternal grandfather; Hypertension in her maternal grandmother and mother; Kidney disease in her paternal grandmother; Miscarriages / Stillbirths in her mother; Pancreatic cancer in her father; Stroke in her father; Thyroid cancer in her mother. Social History:  reports that she has never smoked. She has never used smokeless tobacco. She reports that she does not drink alcohol and does not use drugs.     Maternal Diabetes: No Genetic Screening: Declined Maternal Ultrasounds/Referrals: Normal Fetal Ultrasounds or other Referrals:  None Maternal Substance Abuse:  No Significant Maternal Medications:  None Significant Maternal Lab Results:  Group B Strep negative Other Comments:  None  Review of Systems  Respiratory: Negative.   Cardiovascular: Negative.    Maternal Medical History:  Reason for  admission: Contractions.   Contractions: Frequency: regular.   Perceived severity is moderate.    Fetal activity: Perceived fetal activity is decreased.    Prenatal complications: no prenatal complications Prenatal Complications - Diabetes: none.   AROM clear Dilation: 4 Effacement (%): 50 Station: -2 Exam by:: Dr. Jackelyn Knife Temperature 98.5 F (36.9 C), resp. rate 16, last menstrual period 01/01/2020, SpO2 100 %. Maternal Exam:  Uterine Assessment: Contraction strength is moderate.  Contraction frequency is irregular.   Abdomen: Patient reports no abdominal tenderness. Estimated fetal weight is 7.5 lbs.   Fetal presentation: vertex  Introitus: Normal vulva. Normal vagina.  Amniotic fluid character: clear.  Pelvis: adequate for delivery.      Fetal Exam Fetal Monitor Review: Mode: ultrasound.   Baseline rate: 130s.  Variability: moderate (6-25 bpm).   Pattern: accelerations present and no decelerations.    Fetal State Assessment: Category I - tracings are normal.     Physical Exam Vitals reviewed.  Constitutional:      Appearance: Normal appearance.  Cardiovascular:     Rate and Rhythm: Normal rate and regular rhythm.  Pulmonary:     Effort: Pulmonary effort is normal. No respiratory distress.  Abdominal:     Palpations: Abdomen is soft.  Genitourinary:    General: Normal vulva.  Neurological:     Mental Status: She is alert.     Prenatal labs:  ABO, Rh: --/--/PENDING (03/17 1730) Antibody: PENDING (03/17 1730) Rubella: Nonimmune (08/25 0000) RPR: Nonreactive (08/25 0000)  HBsAg: Negative (08/25 0000)  HIV: Non-reactive (08/25 0000)  GBS:   neg  Assessment/Plan: IUP at 39 weeks in latent labor, reactive NST in MAU with decreased fetal movement.  On my exam, cervix has changed to 4 cm, AROM done, will monitor progress, anticipate SVD   Leighton Roach Sophiea Ueda 11/03/2020, 6:26 PM

## 2020-11-03 NOTE — Anesthesia Preprocedure Evaluation (Signed)
Anesthesia Evaluation  Patient identified by MRN, date of birth, ID band Patient awake    Reviewed: Allergy & Precautions, NPO status , Patient's Chart, lab work & pertinent test results  Airway Mallampati: II  TM Distance: >3 FB Neck ROM: Full    Dental no notable dental hx.    Pulmonary neg pulmonary ROS,    Pulmonary exam normal breath sounds clear to auscultation       Cardiovascular negative cardio ROS Normal cardiovascular exam Rhythm:Regular Rate:Normal     Neuro/Psych negative neurological ROS  negative psych ROS   GI/Hepatic negative GI ROS, Neg liver ROS,   Endo/Other  negative endocrine ROS  Renal/GU negative Renal ROS  negative genitourinary   Musculoskeletal negative musculoskeletal ROS (+)   Abdominal   Peds  Hematology negative hematology ROS (+)   Anesthesia Other Findings Presented in latent labor with DFM  Reproductive/Obstetrics (+) Pregnancy                             Anesthesia Physical Anesthesia Plan  ASA: II  Anesthesia Plan: Epidural   Post-op Pain Management:    Induction:   PONV Risk Score and Plan: Treatment may vary due to age or medical condition  Airway Management Planned: Natural Airway  Additional Equipment:   Intra-op Plan:   Post-operative Plan:   Informed Consent: I have reviewed the patients History and Physical, chart, labs and discussed the procedure including the risks, benefits and alternatives for the proposed anesthesia with the patient or authorized representative who has indicated his/her understanding and acceptance.       Plan Discussed with: Anesthesiologist  Anesthesia Plan Comments: (Patient identified. Risks, benefits, options discussed with patient including but not limited to bleeding, infection, nerve damage, paralysis, failed block, incomplete pain control, headache, blood pressure changes, nausea, vomiting,  reactions to medication, itching, and post partum back pain. Confirmed with bedside nurse the patient's most recent platelet count. Confirmed with the patient that they are not taking any anticoagulation, have any bleeding history or any family history of bleeding disorders. Patient expressed understanding and wishes to proceed. All questions were answered. )        Anesthesia Quick Evaluation

## 2020-11-04 LAB — RPR: RPR Ser Ql: NONREACTIVE

## 2020-11-04 MED ORDER — OXYCODONE HCL 5 MG PO TABS: 10.0000 mg | ORAL_TABLET | ORAL | Status: DC | PRN

## 2020-11-04 MED ORDER — TERBUTALINE SULFATE 1 MG/ML IJ SOLN: 0.2500 mg | Freq: Once | INTRAMUSCULAR | Status: DC | PRN

## 2020-11-04 MED ORDER — COCONUT OIL OIL: 1.0000 "application " | TOPICAL_OIL | Status: DC | PRN

## 2020-11-04 MED ORDER — SIMETHICONE 80 MG PO CHEW: 80.0000 mg | CHEWABLE_TABLET | ORAL | Status: DC | PRN

## 2020-11-04 MED ORDER — SENNOSIDES-DOCUSATE SODIUM 8.6-50 MG PO TABS
2.0000 | ORAL_TABLET | Freq: Every day | ORAL | Status: DC
  Administered 2020-11-05: 2 via ORAL
  Filled 2020-11-04: qty 2

## 2020-11-04 MED ORDER — OXYCODONE HCL 5 MG PO TABS: 5.0000 mg | ORAL_TABLET | ORAL | Status: DC | PRN

## 2020-11-04 MED ORDER — TETANUS-DIPHTH-ACELL PERTUSSIS 5-2.5-18.5 LF-MCG/0.5 IM SUSY: 0.5000 mL | PREFILLED_SYRINGE | Freq: Once | INTRAMUSCULAR | Status: DC

## 2020-11-04 MED ORDER — DIPHENHYDRAMINE HCL 25 MG PO CAPS: 25.0000 mg | ORAL_CAPSULE | Freq: Four times a day (QID) | ORAL | Status: DC | PRN

## 2020-11-04 MED ORDER — PRENATAL MULTIVITAMIN CH
1.0000 | ORAL_TABLET | Freq: Every day | ORAL | Status: DC
  Administered 2020-11-04 – 2020-11-05 (×2): 1 via ORAL
  Filled 2020-11-04 (×2): qty 1

## 2020-11-04 MED ORDER — MAGNESIUM HYDROXIDE 400 MG/5ML PO SUSP: 30.0000 mL | ORAL | Status: DC | PRN

## 2020-11-04 MED ORDER — ONDANSETRON HCL 4 MG/2ML IJ SOLN: 4.0000 mg | INTRAMUSCULAR | Status: DC | PRN

## 2020-11-04 MED ORDER — DIBUCAINE (PERIANAL) 1 % EX OINT
1.0000 | TOPICAL_OINTMENT | CUTANEOUS | Status: DC | PRN
Start: 2020-11-04 — End: 2020-11-05

## 2020-11-04 MED ORDER — ONDANSETRON HCL 4 MG PO TABS: 4.0000 mg | ORAL_TABLET | ORAL | Status: DC | PRN

## 2020-11-04 MED ORDER — METHYLERGONOVINE MALEATE 0.2 MG/ML IJ SOLN: 0.2000 mg | INTRAMUSCULAR | Status: DC | PRN

## 2020-11-04 MED ORDER — WITCH HAZEL-GLYCERIN EX PADS: 1.0000 "application " | MEDICATED_PAD | CUTANEOUS | Status: DC | PRN

## 2020-11-04 MED ORDER — OXYTOCIN-SODIUM CHLORIDE 30-0.9 UT/500ML-% IV SOLN
1.0000 m[IU]/min | INTRAVENOUS | Status: DC
  Administered 2020-11-04: 2 m[IU]/min via INTRAVENOUS
  Filled 2020-11-04: qty 500

## 2020-11-04 MED ORDER — METHYLERGONOVINE MALEATE 0.2 MG PO TABS: 0.2000 mg | ORAL_TABLET | ORAL | Status: DC | PRN

## 2020-11-04 MED ORDER — ACETAMINOPHEN 325 MG PO TABS: 650.0000 mg | ORAL_TABLET | ORAL | Status: DC | PRN

## 2020-11-04 MED ORDER — IBUPROFEN 600 MG PO TABS
600.0000 mg | ORAL_TABLET | Freq: Four times a day (QID) | ORAL | Status: DC
  Administered 2020-11-04 – 2020-11-05 (×5): 600 mg via ORAL
  Filled 2020-11-04 (×5): qty 1

## 2020-11-04 MED ORDER — ZOLPIDEM TARTRATE 5 MG PO TABS: 5.0000 mg | ORAL_TABLET | Freq: Every evening | ORAL | Status: DC | PRN

## 2020-11-04 MED ORDER — BENZOCAINE-MENTHOL 20-0.5 % EX AERO
1.0000 "application " | INHALATION_SPRAY | CUTANEOUS | Status: DC | PRN
  Filled 2020-11-04: qty 56

## 2020-11-04 MED ORDER — MEASLES, MUMPS & RUBELLA VAC IJ SOLR: 0.5000 mL | Freq: Once | INTRAMUSCULAR | Status: DC

## 2020-11-04 NOTE — Progress Notes (Signed)
Comfortable with epidural Afeb, VSS FHT-120s, Cat I, ctx q 3 min  VE-small rim anterior/C/+1, vtx On low dose pitocin, start pushing shortly, anticipate SVD

## 2020-11-04 NOTE — Lactation Note (Signed)
Lactation Consultation Note  Patient Name: Monica Bowers [id number] Date: 11/04/2020 Reason for consult: L&D Initial assessment Age:30 y.o.   L & D Initial Visit:  Baby STS on mother's chest when I arrived; awake and alert.  Assisted to latch easily and baby sucked on/off for 5 minutes.  Reassured mother that lactation assistance will be available on the M/B unit.  Allowed time for family bonding.  Mother appreciative.    Maternal Data    Feeding    LATCH Score Latch: Repeated attempts needed to sustain latch, nipple held in mouth throughout feeding, stimulation needed to elicit sucking reflex.  Audible Swallowing: None  Type of Nipple: Everted at rest and after stimulation  Comfort (Breast/Nipple): Soft / non-tender  Hold (Positioning): Assistance needed to correctly position infant at breast and maintain latch.  LATCH Score: 6   Lactation Tools Discussed/Used    Interventions Interventions: Skin to skin;Assisted with latch  Discharge    Consult Status Consult Status: Follow-up Date: 11/04/20 Follow-up type: In-patient    Jacquelina Hewins R Ruhan Borak 11/04/2020, 3:43 AM

## 2020-11-04 NOTE — Progress Notes (Signed)
Patient ID: Monica Bowers, female   DOB: 1991/02/21, 30 y.o.   MRN: [id number] DOD  Resting in room, no c/o.

## 2020-11-04 NOTE — Lactation Note (Signed)
This note was copied from a baby's chart. Lactation Consultation Note  Patient Name: Monica Bowers [id number] Date: 11/04/2020 Reason for consult: Initial assessment;Mother's request;Term Age:30 hours  Mom states feeding going well. She denied any pain with latching. Mom states infant showing preference with latch on the left side. LC assisted Mom latching infant on the right with breast compression, increase depth of swallows noted during the feeding.  Mom's nipples both erect with no signs of compression or trauma.  Plan 1. To feed based on cues 8-12x in 24 hrs no more than 4hrs without an attempt. Mom to offer any EBM via spoon or finger feeding if unable to get infant to latch.           2. I and O sheet reviewed           3 LC brochure of inpatient and outpatient services reviewed.  All questions answered at the end of the visit.   Maternal Data Has patient been taught Hand Expression?: Yes Does the patient have breastfeeding experience prior to this delivery?: Yes How long did the patient breastfeed?: At the breasts for 2 months, infant did not latch had problems with bilirubin. Mom pumped and bottle fed EBM for 6 months  Feeding Mother's Current Feeding Choice: Breast Milk  LATCH Score Latch: Grasps breast easily, tongue down, lips flanged, rhythmical sucking.  Audible Swallowing: Spontaneous and intermittent  Type of Nipple: Everted at rest and after stimulation  Comfort (Breast/Nipple): Soft / non-tender  Hold (Positioning): Assistance needed to correctly position infant at breast and maintain latch.  LATCH Score: 9   Lactation Tools Discussed/Used    Interventions Interventions: Breast feeding basics reviewed;Breast compression;Assisted with latch;Adjust position;Skin to skin;Support pillows;Breast massage;Position options;Hand express;Expressed milk;Education;Coconut oil  Discharge Pump: Personal WIC Program: No  Consult Status Consult Status:  Follow-up Date: 11/05/20 Follow-up type: In-patient    Suttyn Cryder  Nicholson-Springer 11/04/2020, 4:50 PM

## 2020-11-04 NOTE — Anesthesia Postprocedure Evaluation (Signed)
Anesthesia Post Note  Patient: Monica Bowers  Procedure(s) Performed: AN AD HOC LABOR EPIDURAL     Patient location during evaluation: Mother Baby Anesthesia Type: Epidural Level of consciousness: awake and alert, oriented and patient cooperative Pain management: pain level controlled Vital Signs Assessment: post-procedure vital signs reviewed and stable Respiratory status: spontaneous breathing Cardiovascular status: stable Postop Assessment: no headache, epidural receding, patient able to bend at knees and no signs of nausea or vomiting Anesthetic complications: no Comments: Pt. States she is walking. Pain score 1.    No complications documented.  Last Vitals:  Vitals:   11/04/20 1017 11/04/20 1405  BP: 114/69 121/81  Pulse: 71 69  Resp: 18 20  Temp: 36.5 C 36.6 C  SpO2:      Last Pain:  Vitals:   11/04/20 1406  TempSrc:   PainSc: 0-No pain   Pain Goal: Patients Stated Pain Goal: 6 (11/03/20 1849)                 Merrilyn Puma

## 2020-11-05 MED ORDER — IBUPROFEN 600 MG PO TABS: 600.0000 mg | ORAL_TABLET | Freq: Four times a day (QID) | ORAL | 0 refills | Status: DC

## 2020-11-05 MED ORDER — ACETAMINOPHEN 325 MG PO TABS: 650.0000 mg | ORAL_TABLET | ORAL | 1 refills | Status: DC | PRN

## 2020-11-05 NOTE — Progress Notes (Signed)
Post Partum Day 1 Subjective: no complaints, up ad lib and tolerating PO.  Wants to go home today if baby able to go.  Objective: Blood pressure 118/65, pulse 68, temperature 98.1 F (36.7 C), resp. rate 16, height 5\' 7"  (1.702 m), weight 72.6 kg, last menstrual period 01/01/2020, SpO2 100 %.  Physical Exam:  General: alert and cooperative Lochia: appropriate Uterine Fundus: firm   Recent Labs    11/03/20 1737  HGB 10.1*  HCT 33.0*    Assessment/Plan: Discharge home if baby able to go   LOS: 2 days   11/05/20 11/05/2020, 7:40 AM

## 2020-11-05 NOTE — Discharge Summary (Signed)
Postpartum Discharge Summary       Patient Name: Monica Bowers DOB: [birth year] MRN: [id number]  Date of admission: 11/03/2020 Delivery date:11/04/2020  Delivering provider: Jackelyn Knife, TODD  Date of discharge: 11/05/2020  Admitting diagnosis: Indication for care in labor or delivery [O75.9] Intrauterine pregnancy: [redacted]w[redacted]d     Secondary diagnosis:  Active Problems:   Indication for care in labor or delivery   SVD (spontaneous vaginal delivery)  Additional problems: none    Discharge diagnosis: Term Pregnancy Delivered                                              Post partum procedures:none Augmentation: AROM and Pitocin Complications: None  Hospital course: Onset of Labor With Vaginal Delivery      30 y.o. yo G2P1001 at [redacted]w[redacted]d was admitted in Latent Labor on 11/03/2020. Patient had an uncomplicated labor course as follows:  Membrane Rupture Time/Date: 6:22 PM ,11/03/2020   Delivery Method:Vaginal, Spontaneous  Episiotomy: None  Lacerations:  None  Patient had an uncomplicated postpartum course.  She is ambulating, tolerating a regular diet, passing flatus, and urinating well. Patient is discharged home in stable condition on 11/05/20.  Newborn Data: Birth date:11/04/2020  Birth time:3:01 AM  Gender:Female  Living status:Living  Apgars:9 ,9  Weight:3150 g   Magnesium Sulfate received: No BMZ received: No Rhophylac:No   Physical exam  Vitals:   11/04/20 1405 11/04/20 1746 11/04/20 2033 11/05/20 0500  BP: 121/81 122/77 113/60 118/65  Pulse: 69 61 62 68  Resp: 20 18 16 16   Temp: 97.8 F (36.6 C) 97.6 F (36.4 C) 98 F (36.7 C) 98.1 F (36.7 C)  TempSrc: Oral Axillary Axillary   SpO2:   99% 100%  Weight:      Height:       General: alert and cooperative Lochia: appropriate Uterine Fundus: firm  Labs: Lab Results  Component Value Date   WBC 11.0 (H) 11/03/2020   HGB 10.1 (L) 11/03/2020   HCT 33.0 (L) 11/03/2020   MCV 89.4 11/03/2020   PLT 356  11/03/2020   CMP Latest Ref Rng & Units 04/22/2019  Glucose 70 - 99 mg/dL 96  BUN 6 - 20 mg/dL 11  Creatinine 06/22/2019 - 2.33 mg/dL 0.07  Sodium 6.22 - 633 mmol/L 141  Potassium 3.5 - 5.1 mmol/L 3.8  Chloride 98 - 111 mmol/L 105  CO2 22 - 32 mmol/L 28  Calcium 8.9 - 10.3 mg/dL 9.4  Total Protein 6.5 - 8.1 g/dL 7.6  Total Bilirubin 0.3 - 1.2 mg/dL 1.2  Alkaline Phos 38 - 126 U/L 72  AST 15 - 41 U/L 16  ALT 0 - 44 U/L 15   Edinburgh Score: No flowsheet data found.   After visit meds:  Allergies as of 11/05/2020   No Known Allergies     Medication List    TAKE these medications   acetaminophen 325 MG tablet Commonly known as: Tylenol Take 2 tablets (650 mg total) by mouth every 4 (four) hours as needed (for pain scale < 4).   ibuprofen 600 MG tablet Commonly known as: ADVIL Take 1 tablet (600 mg total) by mouth every 6 (six) hours.   multivitamin tablet Take 1 tablet by mouth daily.   pantoprazole 20 MG tablet Commonly known as: PROTONIX Take 20 mg by mouth daily.  Discharge home in stable condition Infant Feeding: Breast Infant Disposition:home with mother Discharge instruction: per After Visit Summary and Postpartum booklet. Activity: Advance as tolerated. Pelvic rest for 6 weeks.  Diet: routine diet Future Appointments: Future Appointments  Date Time Provider Department Center  11/07/2020 10:45 AM [facility] None   Follow up Visit:  Follow-up Information    Meisinger, Todd, MD. Schedule an appointment as soon as possible for a visit in 6 week(s).   Specialty: Obstetrics and Gynecology Why: postpartum Contact information: [address] [place] [address] [phone]                Please schedule this patient for a In person postpartum visit in 6 weeks with the following provider: MD.  Delivery mode:  Vaginal, Spontaneous  Anticipated Birth Control:  POPs   11/05/2020 Oliver Pila, MD

## 2020-11-07 ENCOUNTER — Other Ambulatory Visit (HOSPITAL_COMMUNITY): Payer: 59

## 2020-11-08 ENCOUNTER — Inpatient Hospital Stay (HOSPITAL_COMMUNITY): Payer: 59

## 2020-11-08 ENCOUNTER — Inpatient Hospital Stay (HOSPITAL_COMMUNITY): Admission: AD | Admit: 2020-11-08 | Payer: 59 | Source: Home / Self Care | Admitting: Obstetrics and Gynecology

## 2021-05-15 ENCOUNTER — Other Ambulatory Visit: Payer: Self-pay

## 2021-05-15 ENCOUNTER — Ambulatory Visit (INDEPENDENT_AMBULATORY_CARE_PROVIDER_SITE_OTHER): Payer: 59 | Admitting: Family Medicine

## 2021-05-15 ENCOUNTER — Encounter: Payer: Self-pay | Admitting: Family Medicine

## 2021-05-15 VITALS — BP 100/50 | HR 89 | Temp 97.0°F | Ht 67.0 in | Wt 124.0 lb

## 2021-05-15 DIAGNOSIS — K581 Irritable bowel syndrome with constipation: Secondary | ICD-10-CM

## 2021-05-15 DIAGNOSIS — K589 Irritable bowel syndrome without diarrhea: Secondary | ICD-10-CM | POA: Insufficient documentation

## 2021-05-15 DIAGNOSIS — Z23 Encounter for immunization: Secondary | ICD-10-CM

## 2021-05-15 MED ORDER — LINACLOTIDE 290 MCG PO CAPS: 290.0000 ug | ORAL_CAPSULE | Freq: Every day | ORAL | 1 refills | Status: DC

## 2021-05-15 NOTE — Assessment & Plan Note (Signed)
Pt with hx of constipation which has responded to linzess before. Will prescribe. Handout with alternative options if linzess not covered by insurance. Return prn

## 2021-05-15 NOTE — Patient Instructions (Signed)
Constipation   Constipation is a common issue. Often it is related to diet and occasionally medications.    What you can do to treat your symptoms 1) Fiber -- Eat more fiber rich foods: beans, broccoli, berries, avocados, popcorn, pear/apple, green peas, turnip greens, brussels sprouts, whole grains (barley, bran, quinoa, oatmeal) -- Take a Fiber supplement: Psyllium (Metamucil)  -- Could also eat Prunes daily  2) Hydration  -- Drink more water: Try to drink 64 oz of water per day  3) Exercise -- Moderate exercise (walking, jogging, biking) for 30 minutes, 5 days a week  4) Dedicate time for Bowel movements - do not delay  5) Stool Softener  - Docusate Sodium (Colace) 100 mg daily or twice daily as needed   If 4-6 weeks have passed and the above has not helped then start the following 6) Laxatives -- Polyethylene Glycol (Miralax) - begin with once daily. After a few days can increase to twice daily Or -- Magnesium Citrate -- Common side effect is nausea and diarrhea -- can try if still not improved   Treating chronic constipation is often about finding the right amount of medication and fiber to keep you regular and comfortable. For some people that may be daily metamucil and colace every other day. For others it may be Metamucil and colace twice daily and Miralax 3 times a week. The goal is to go slow and listen to your body. And normal can be anywhere from 2-3 soft bowel movements a day to 1 bowel movement every 2-3 days.   

## 2021-05-15 NOTE — Progress Notes (Signed)
Subjective:     Monica Bowers is a 30 y.o. female presenting for Immunizations (Rubella ) and Constipation (Since giving birth in March. Pt requesting linzess rx)     Constipation   #Rubella - non-immune in pregnancy  #Constipation - in 2017 - had an XR and found severe constipation - was started on linezess w/ improvement - did get better after her first baby - now high fiber diet, occasional stool softener w/ some improvement  - still feels backed up with regular use of colace - occasional severe cramps 2/2 to constipation   Review of Systems  Gastrointestinal:  Positive for constipation.    Social History   Tobacco Use  Smoking Status Never  Smokeless Tobacco Never        Objective:    BP Readings from Last 3 Encounters:  05/15/21 (!) 100/50  11/05/20 118/65  01/07/20 124/78   Wt Readings from Last 3 Encounters:  05/15/21 124 lb (56.2 kg)  11/03/20 160 lb (72.6 kg)  01/07/20 122 lb 4 oz (55.5 kg)    BP (!) 100/50   Pulse 89   Temp (!) 97 F (36.1 C) (Temporal)   Ht _0  (1.702 m)   Wt 124 lb (56.2 kg)   SpO2 100%   Breastfeeding Yes   BMI 19.42 kg/m    Physical Exam Constitutional:      General: She is not in acute distress.    Appearance: She is well-developed. She is not diaphoretic.  HENT:     Right Ear: External ear normal.     Left Ear: External ear normal.  Eyes:     Conjunctiva/sclera: Conjunctivae normal.  Cardiovascular:     Rate and Rhythm: Normal rate and regular rhythm.     Heart sounds: No murmur heard. Pulmonary:     Effort: Pulmonary effort is normal. No respiratory distress.     Breath sounds: Normal breath sounds. No wheezing.  Abdominal:     General: Abdomen is flat. Bowel sounds are normal. There is no distension.     Palpations: Abdomen is soft.     Tenderness: There is no abdominal tenderness. There is no guarding or rebound.  Musculoskeletal:     Cervical back: Neck supple.  Skin:     General: Skin is warm and dry.     Capillary Refill: Capillary refill takes less than 2 seconds.  Neurological:     Mental Status: She is alert. Mental status is at baseline.  Psychiatric:        Mood and Affect: Mood normal.        Behavior: Behavior normal.          Assessment & Plan:   Problem List Items Addressed This Visit       Digestive   IBS (irritable bowel syndrome) - Primary    Pt with hx of constipation which has responded to linzess before. Will prescribe. Handout with alternative options if linzess not covered by insurance. Return prn      Relevant Medications   linaclotide (LINZESS) 290 MCG CAPS capsule   Other Visit Diagnoses     Need for MMR vaccine       Relevant Orders   MMR vaccine subcutaneous (Completed)      Reviewed recent hospital stay for delivery of infant - Rubella non-immune confirmed. Also no documented MMR vaccine and record seems to indicate this was deferred. Will give today.   Return if symptoms worsen or fail to improve.  Janett Billow  Karie Soda, MD  This visit occurred during the SARS-CoV-2 public health emergency.  Safety protocols were in place, including screening questions prior to the visit, additional usage of staff PPE, and extensive cleaning of exam room while observing appropriate contact time as indicated for disinfecting solutions.

## 2022-01-02 IMAGING — US US ABDOMEN LIMITED
1 series · 14 of 25 positions shown · non-contrast
Comparison: 04/22/2019

CLINICAL DATA: Right liver lesions follow-up

EXAM:
ULTRASOUND ABDOMEN LIMITED RIGHT UPPER QUADRANT

[Series 1: us abdomen limited · 0.19mm/px · 14 of 58 slices shown]
[im 1/58]
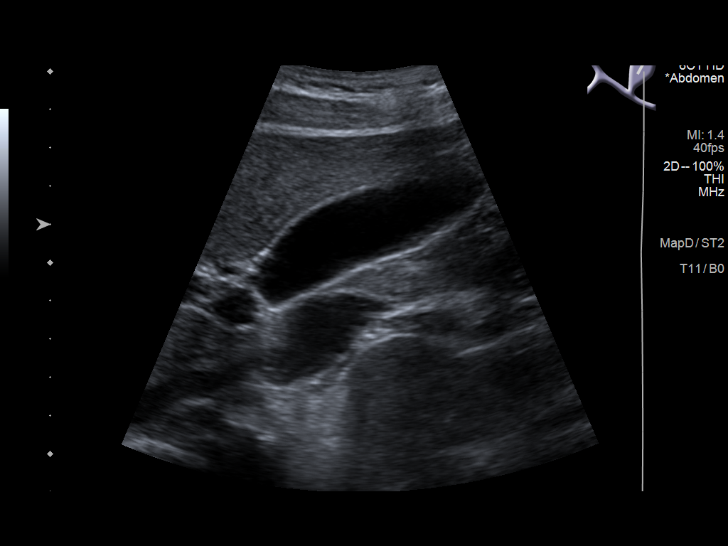
[im 5/58]
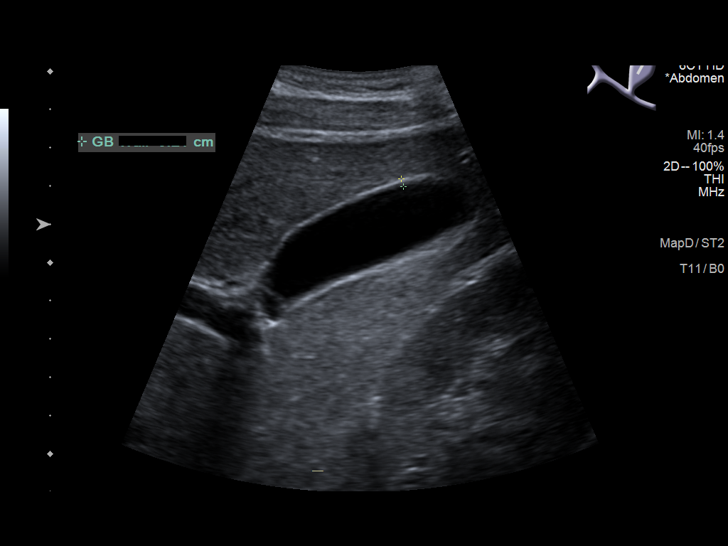
[im 10/58]
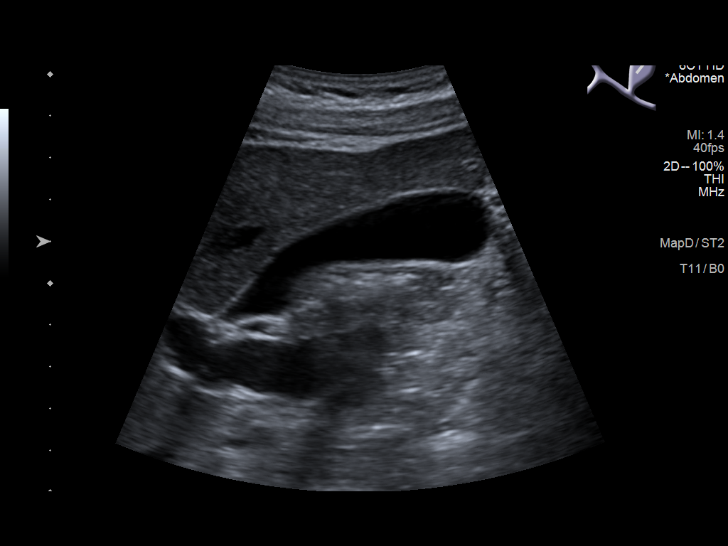
[im 15/58]
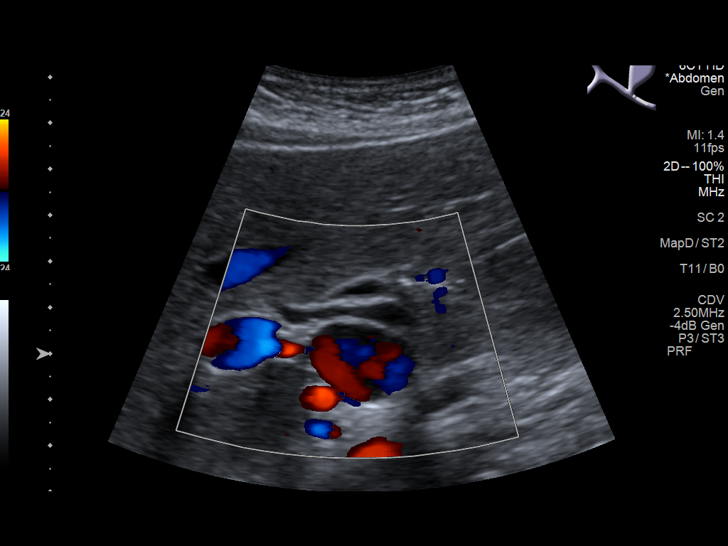
[im 20/58]
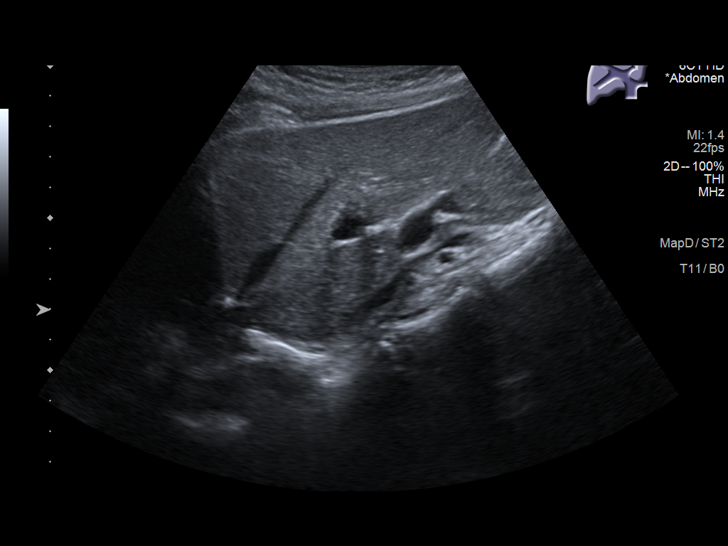
[im 22/58]
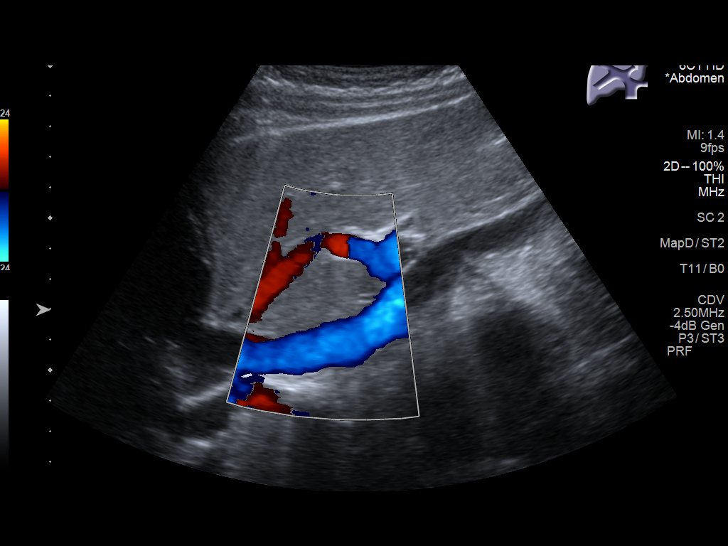
[im 27/58]
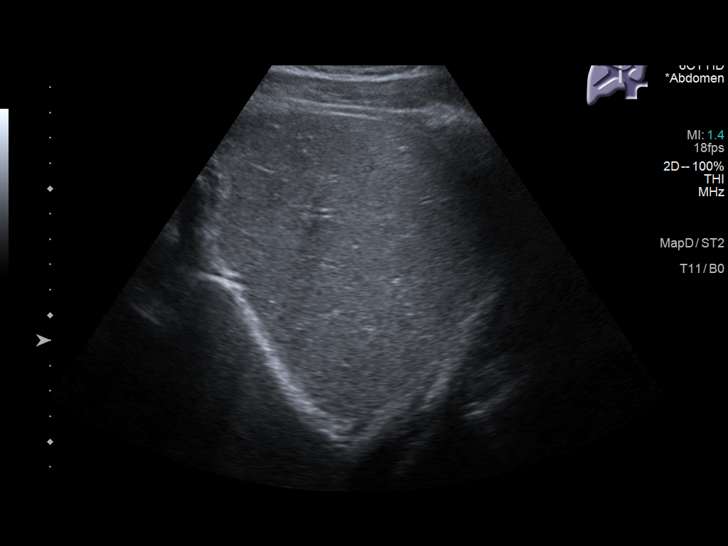
[im 31/58]
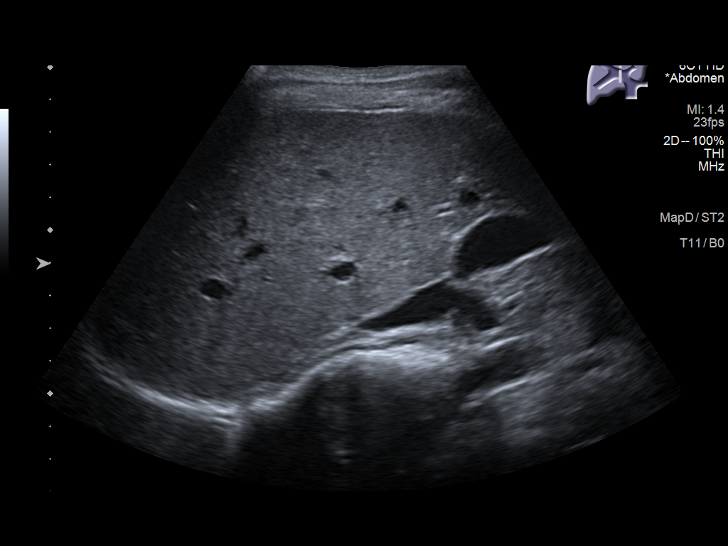
[im 36/58]
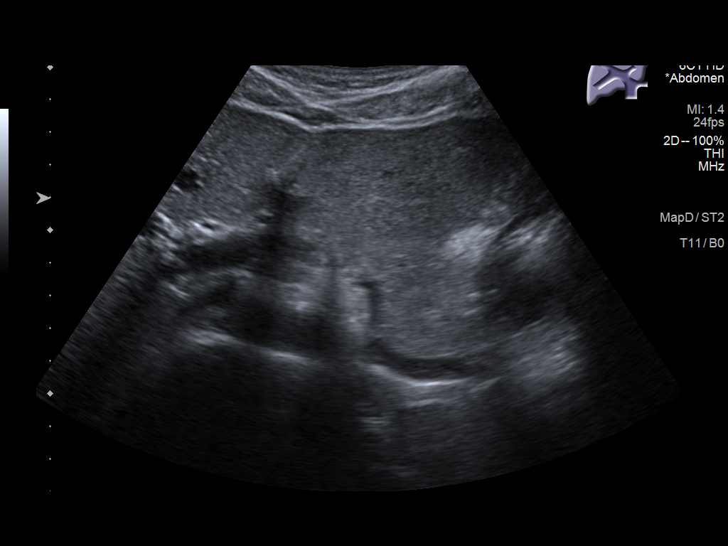
[im 39/58]
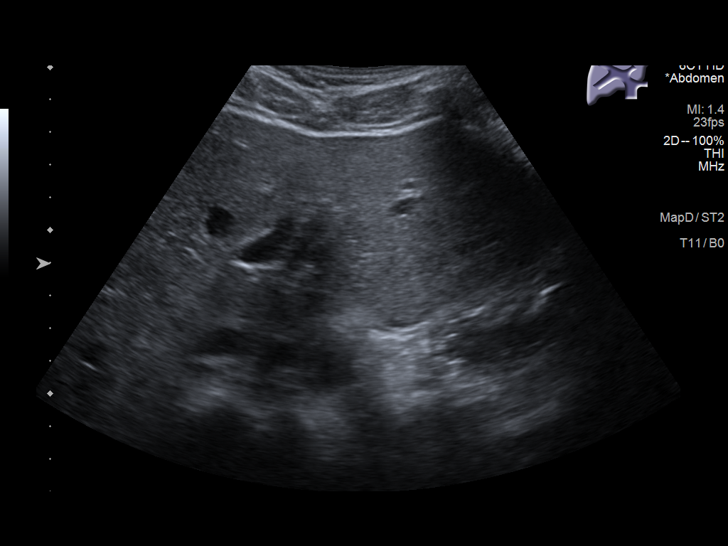
[im 43/58]
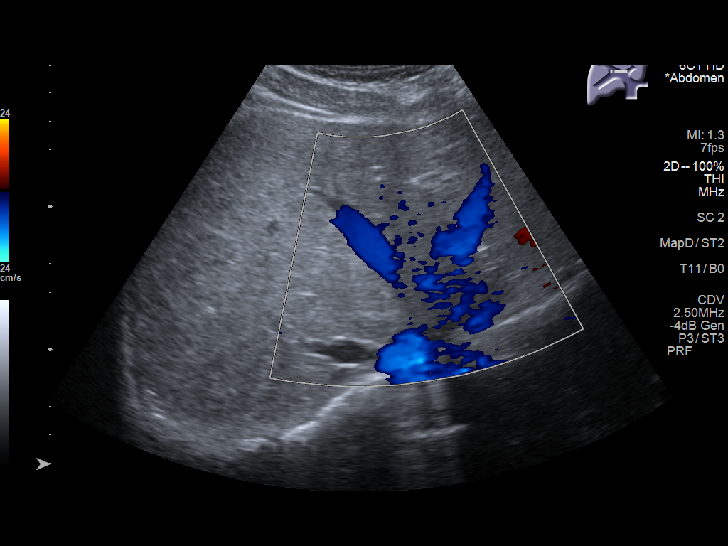
[im 48/58]
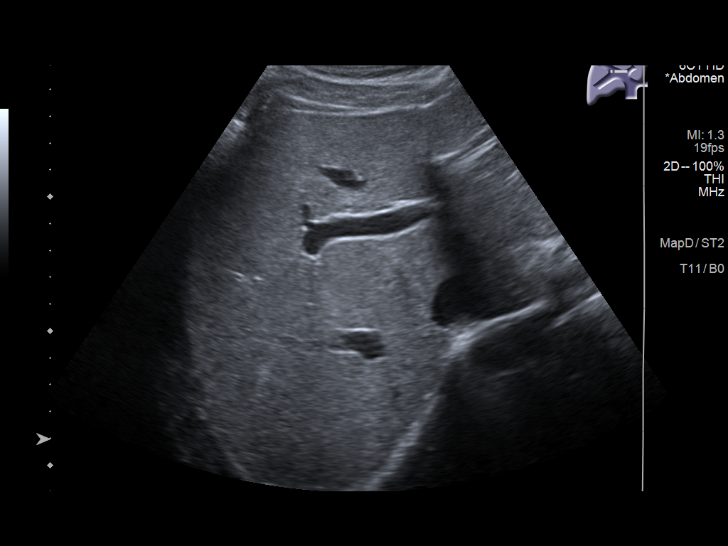
[im 53/58]
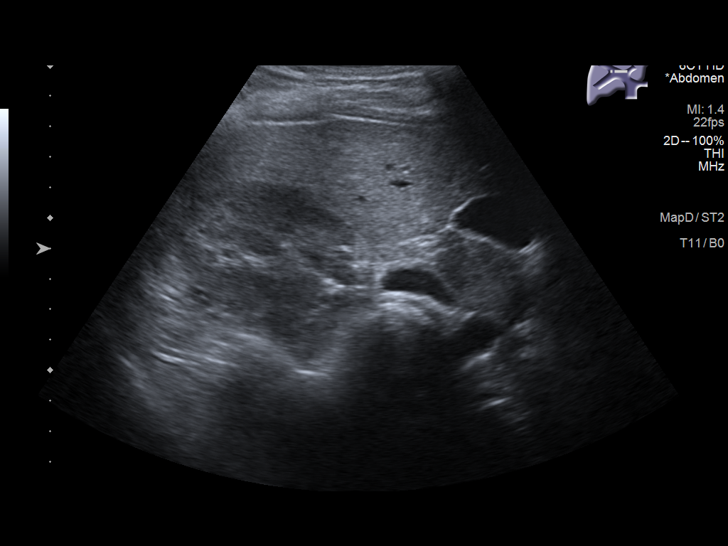
[im 58/58]
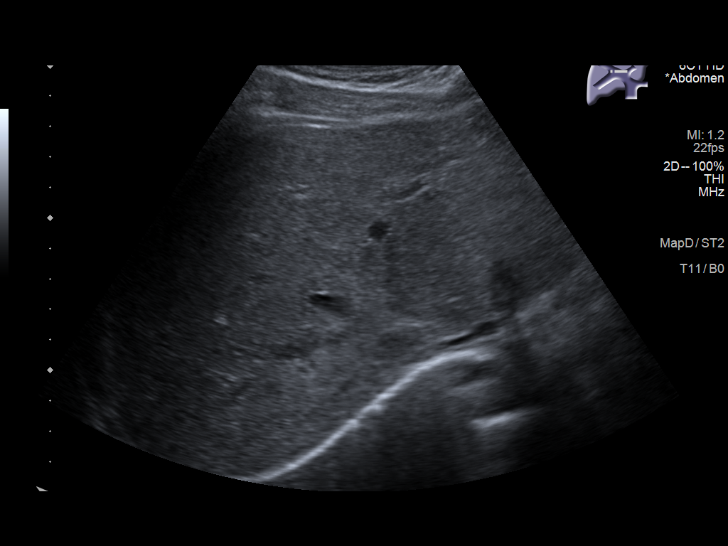

[14 of 25 positions shown; findings below may reference images not displayed]

FINDINGS: Gallbladder:

No gallstones or wall thickening visualized. No sonographic Murphy
sign noted by sonographer.

Common bile duct:

Diameter: 4 mm

Liver:

There are 2 hyperechoic lesions again identified within the right
lobe measuring 8 x 8 x 8 mm (previously 8 x 8 x 7 mm) and 11 x 7 x 6
mm (previously 7 x 5 x 8 mm). Within normal limits in parenchymal
echogenicity. Portal vein is patent on color Doppler imaging with
normal direction of blood flow towards the liver.

Other: None.
IMPRESSION: Two echogenic lesions are again identified within the right lobe of
the liver. One of the lesions measures slightly larger, but this may
be on a technical basis. These are again favored to reflect benign
hemangiomas but given potential change, additional six-month
follow-up is recommended.

## 2022-02-16 ENCOUNTER — Ambulatory Visit: Admitting: Family Medicine

## 2022-02-16 ENCOUNTER — Encounter: Payer: Self-pay | Admitting: Family Medicine

## 2022-02-16 VITALS — BP 80/50 | HR 66 | Temp 97.9°F | Ht 67.0 in | Wt 124.2 lb

## 2022-02-16 DIAGNOSIS — D649 Anemia, unspecified: Secondary | ICD-10-CM

## 2022-02-16 DIAGNOSIS — Z13220 Encounter for screening for lipoid disorders: Secondary | ICD-10-CM

## 2022-02-16 DIAGNOSIS — Z7721 Contact with and (suspected) exposure to potentially hazardous body fluids: Secondary | ICD-10-CM | POA: Insufficient documentation

## 2022-02-16 LAB — CBC WITH DIFFERENTIAL/PLATELET
Basophils Absolute: 0.1 10*3/uL (ref 0.0–0.1)
Basophils Relative: 1.1 % (ref 0.0–3.0)
Eosinophils Absolute: 0.2 10*3/uL (ref 0.0–0.7)
Eosinophils Relative: 5.4 % — ABNORMAL HIGH (ref 0.0–5.0)
HCT: 39.9 % (ref 36.0–46.0)
Hemoglobin: 13 g/dL (ref 12.0–15.0)
Lymphocytes Relative: 40.4 % (ref 12.0–46.0)
Lymphs Abs: 1.9 10*3/uL (ref 0.7–4.0)
MCHC: 32.7 g/dL (ref 30.0–36.0)
MCV: 96.1 fl (ref 78.0–100.0)
Monocytes Absolute: 0.3 10*3/uL (ref 0.1–1.0)
Monocytes Relative: 6.6 % (ref 3.0–12.0)
Neutro Abs: 2.1 10*3/uL (ref 1.4–7.7)
Neutrophils Relative %: 46.5 % (ref 43.0–77.0)
Platelets: 295 10*3/uL (ref 150.0–400.0)
RBC: 4.15 Mil/uL (ref 3.87–5.11)
RDW: 13.8 % (ref 11.5–15.5)
WBC: 4.6 10*3/uL (ref 4.0–10.5)

## 2022-02-16 LAB — LIPID PANEL
Cholesterol: 171 mg/dL (ref 0–200)
HDL: 52 mg/dL (ref >39.00)
LDL Cholesterol: 101 mg/dL — ABNORMAL HIGH (ref 0–99)
NonHDL: 118.61
Total CHOL/HDL Ratio: 3
Triglycerides: 87 mg/dL (ref 0.0–149.0)
VLDL: 17.4 mg/dL (ref 0.0–40.0)

## 2022-02-16 LAB — COMPREHENSIVE METABOLIC PANEL
ALT: 12 U/L (ref 0–35)
AST: 15 U/L (ref 0–37)
Albumin: 4.9 g/dL (ref 3.5–5.2)
Alkaline Phosphatase: 77 U/L (ref 39–117)
BUN: 10 mg/dL (ref 6–23)
CO2: 29 mEq/L (ref 19–32)
Calcium: 9.5 mg/dL (ref 8.4–10.5)
Chloride: 106 mEq/L (ref 96–112)
Creatinine, Ser: 0.83 mg/dL (ref 0.40–1.20)
GFR: 94.18 mL/min (ref >60.00)
Glucose, Bld: 84 mg/dL (ref 70–99)
Potassium: 4.5 mEq/L (ref 3.5–5.1)
Sodium: 140 mEq/L (ref 135–145)
Total Bilirubin: 1.1 mg/dL (ref 0.2–1.2)
Total Protein: 7.4 g/dL (ref 6.0–8.3)

## 2022-02-16 NOTE — Assessment & Plan Note (Signed)
Needlestick exposure at work, patient with no new testing but prior negative HIV and hepatitis C.  We will check hepatitis B and including immunity as prior labs and immunity from vaccination.  Also HIV and hepatitis C today and in approximately 6 weeks.  Discussed we could check HIV again in 6 months.  Patient will update if she would like to do this.

## 2022-02-16 NOTE — Patient Instructions (Signed)
Return in 6 weeks for HIV and Hep C retesting  Can do up to 6 months for HIV if you want

## 2022-02-16 NOTE — Assessment & Plan Note (Signed)
Last labs were approximately 1 year ago in the setting of pregnancy.  We will recheck today.

## 2022-02-16 NOTE — Progress Notes (Signed)
   Subjective:     Monica Bowers is a 31 y.o. female presenting for Labs Only (After cut at work. STD testing requested)     HPI  Work related forceps injury [employer]  Exposure was Monday  Normal state health  No fever or chills   Review of Systems   Social History   Tobacco Use  Smoking Status Never  Smokeless Tobacco Never        Objective:    BP Readings from Last 3 Encounters:  02/16/22 (!) 80/50  05/15/21 (!) 100/50  11/05/20 118/65   Wt Readings from Last 3 Encounters:  02/16/22 124 lb 4 oz (56.4 kg)  05/15/21 124 lb (56.2 kg)  11/03/20 160 lb (72.6 kg)    BP (!) 80/50   Pulse 66   Temp 97.9 F (36.6 C) (Temporal)   Ht 5\' 7"  (1.702 m)   Wt 124 lb 4 oz (56.4 kg)   LMP 02/13/2022 (Exact Date)   SpO2 99%   Breastfeeding No   BMI 19.46 kg/m    Physical Exam Constitutional:      General: She is not in acute distress.    Appearance: She is well-developed. She is not diaphoretic.  HENT:     Right Ear: External ear normal.     Left Ear: External ear normal.     Nose: Nose normal.  Eyes:     Conjunctiva/sclera: Conjunctivae normal.  Cardiovascular:     Rate and Rhythm: Normal rate.  Pulmonary:     Effort: Pulmonary effort is normal.  Musculoskeletal:     Cervical back: Neck supple.  Skin:    General: Skin is warm and dry.     Capillary Refill: Capillary refill takes less than 2 seconds.  Neurological:     Mental Status: She is alert. Mental status is at baseline.  Psychiatric:        Mood and Affect: Mood normal.        Behavior: Behavior normal.           Assessment & Plan:   Problem List Items Addressed This Visit       Other   Anemia    Last labs were approximately 1 year ago in the setting of pregnancy.  We will recheck today.      Relevant Orders   CBC with Differential   Exposure to blood-borne pathogen - Primary    Needlestick exposure at work, patient with no new testing but prior negative  HIV and hepatitis C.  We will check hepatitis B and including immunity as prior labs and immunity from vaccination.  Also HIV and hepatitis C today and in approximately 6 weeks.  Discussed we could check HIV again in 6 months.  Patient will update if she would like to do this.      Relevant Orders   Hepatitis C Antibody   HIV Antibody (routine testing w rflx)   Hepatitis B surface antigen   Hepatitis B surface antibody,quantitative   Hepatitis B core antibody, total   Comprehensive metabolic panel   HIV Antibody (routine testing w rflx)   Hepatitis C Antibody   Other Visit Diagnoses     Screening for hyperlipidemia       Relevant Orders   Lipid panel        Return if symptoms worsen or fail to improve.  02/15/2022, MD

## 2022-02-19 LAB — HEPATITIS C ANTIBODY: Hepatitis C Ab: NONREACTIVE

## 2022-02-19 LAB — HEPATITIS B SURFACE ANTIBODY, QUANTITATIVE: Hep B S AB Quant (Post): 41 m[IU]/mL (ref >=10)

## 2022-02-19 LAB — HEPATITIS B SURFACE ANTIGEN: Hepatitis B Surface Ag: NONREACTIVE

## 2022-02-19 LAB — HEPATITIS B CORE ANTIBODY, TOTAL: Hep B Core Total Ab: NONREACTIVE

## 2022-02-19 LAB — HIV ANTIBODY (ROUTINE TESTING W REFLEX): HIV 1&2 Ab, 4th Generation: NONREACTIVE

## 2022-06-14 IMAGING — US US ABDOMEN LIMITED
1 series · 14 of 25 positions shown · non-contrast
Comparison: September 14, 2019.

CLINICAL DATA: Right hepatic lesion.

EXAM:
ULTRASOUND ABDOMEN LIMITED RIGHT UPPER QUADRANT

[Series 1: us abdomen limited · 0.17mm/px · 14 of 45 slices shown]
[im 1/45]
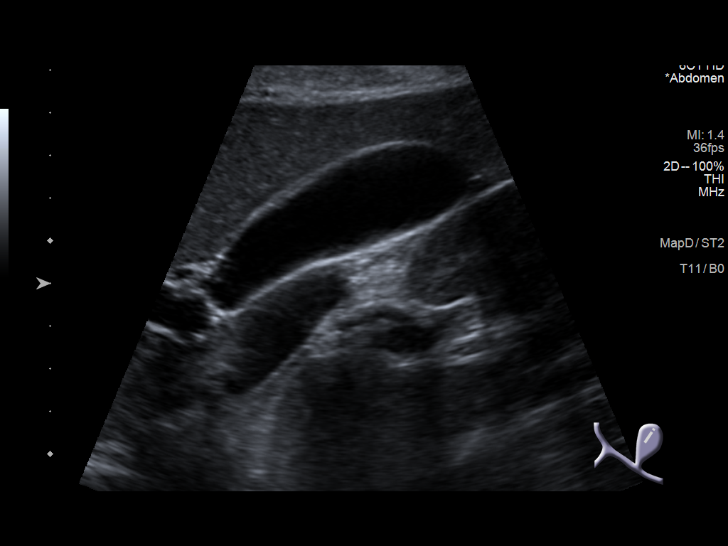
[im 4/45]
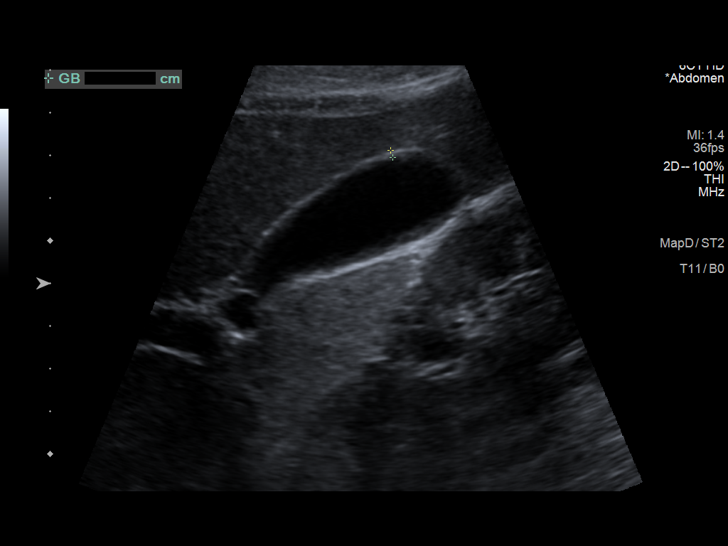
[im 8/45]
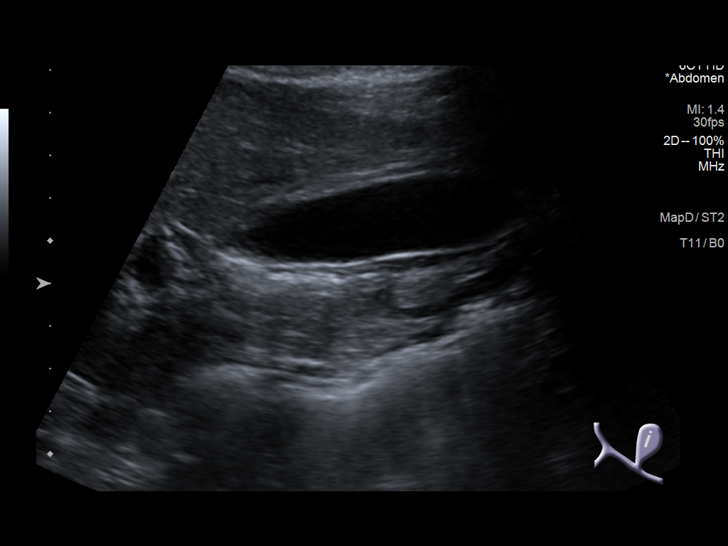
[im 12/45]
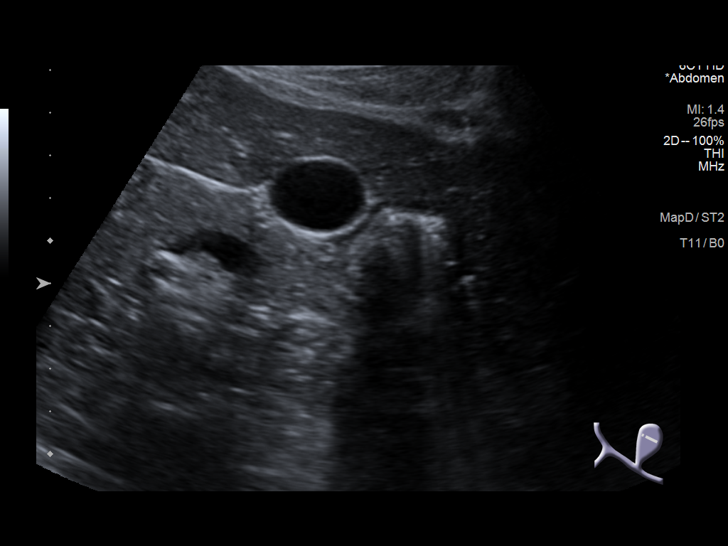
[im 15/45]
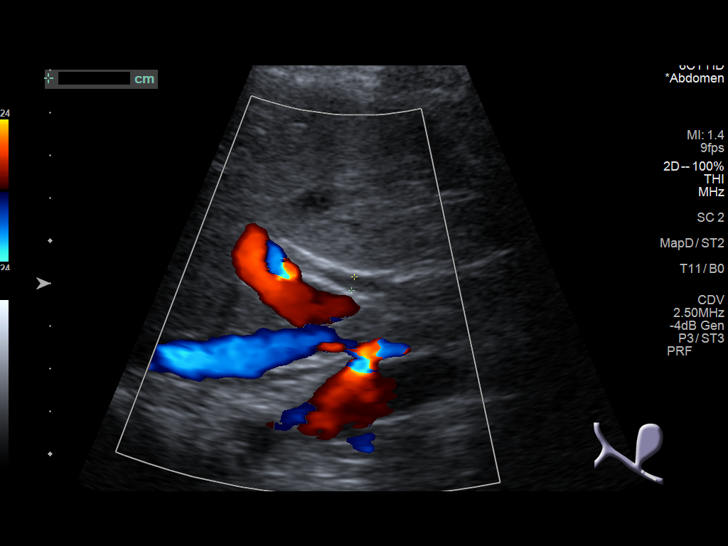
[im 17/45]
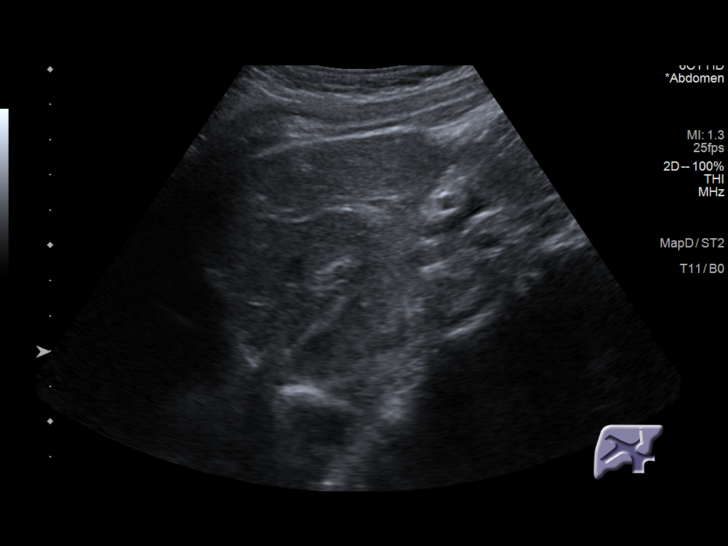
[im 21/45]
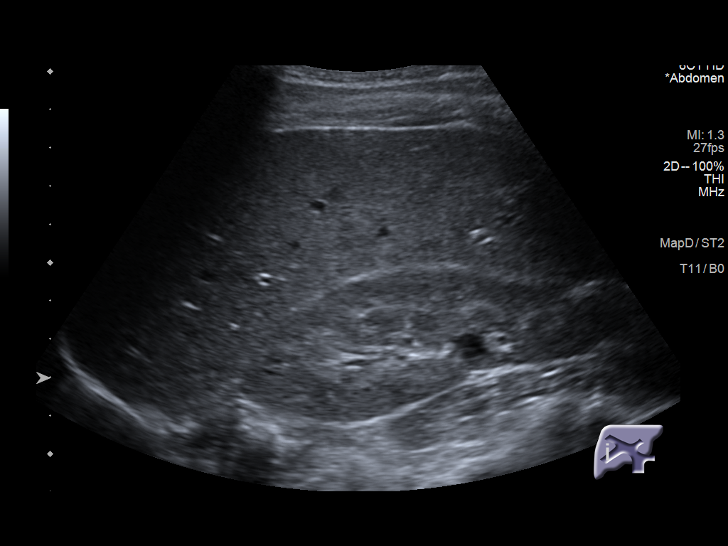
[im 24/45]
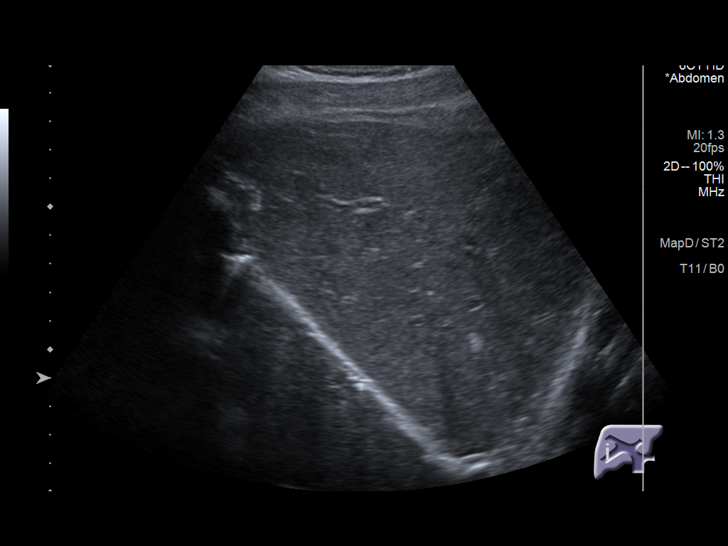
[im 28/45]
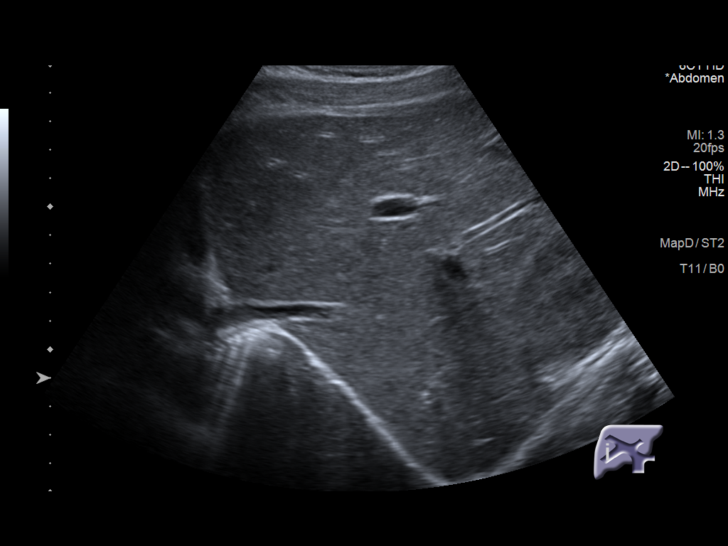
[im 30/45]
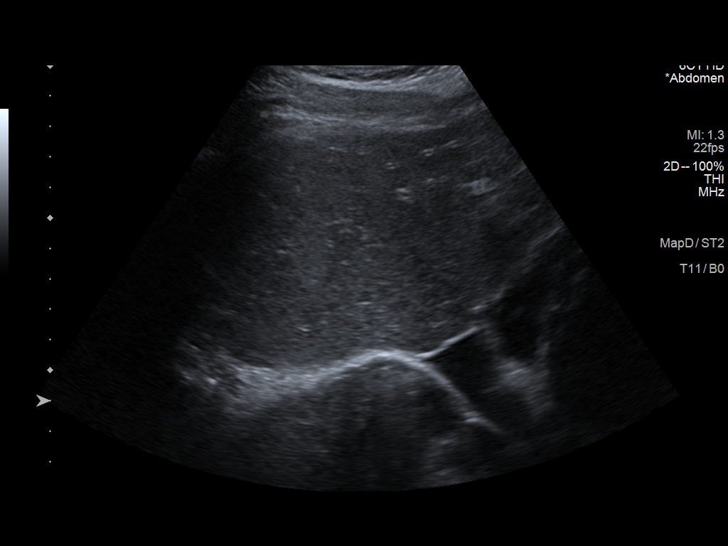
[im 34/45]
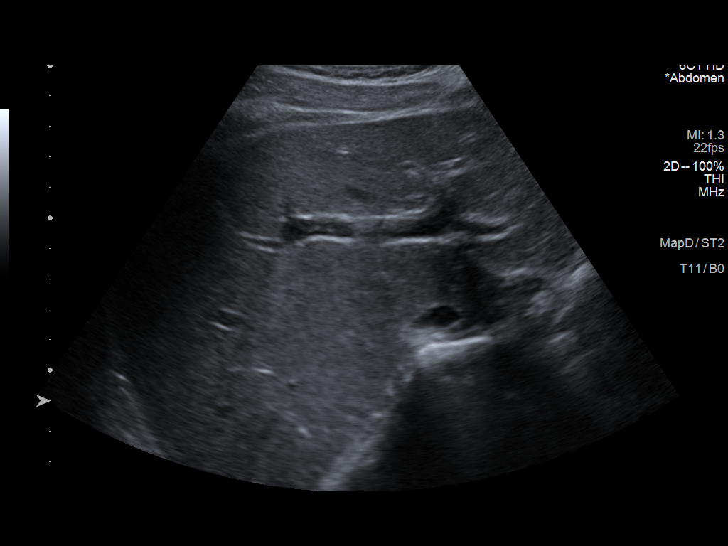
[im 37/45]
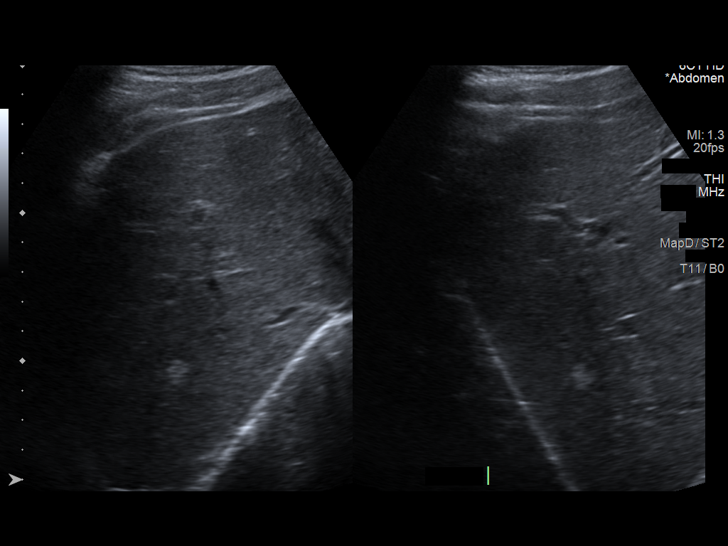
[im 41/45]
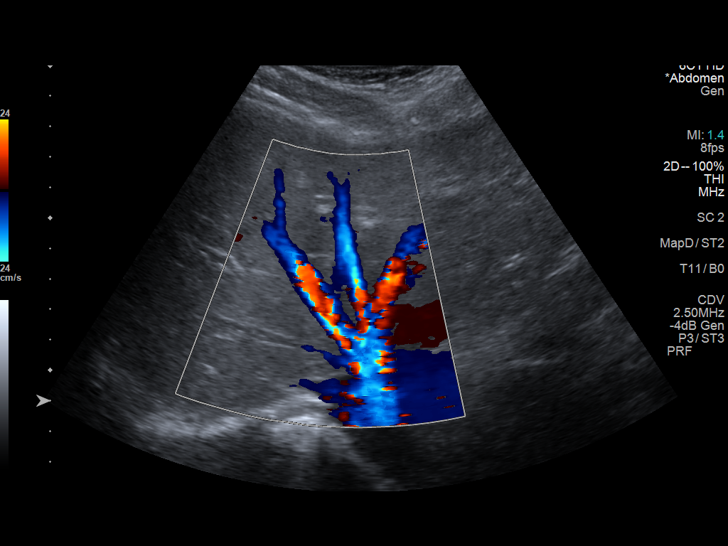
[im 45/45]
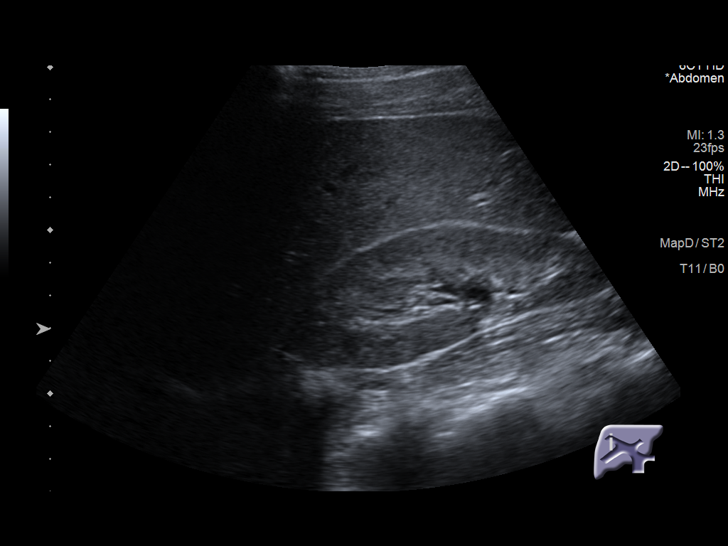

[14 of 25 positions shown; findings below may reference images not displayed]

FINDINGS: Gallbladder:

No gallstones or wall thickening visualized. No sonographic Murphy
sign noted by sonographer.

Common bile duct:

Diameter: 3 mm which is within normal limits.

Liver:

1.1 cm rounded echogenic focus is noted in right hepatic lobe which
is not significantly changed compared to prior exam. 8 mm rounded
echogenic focus is noted posteriorly in right hepatic lobe which is
unchanged compared to prior exam. Within normal limits in
parenchymal echogenicity. Portal vein is patent on color Doppler
imaging with normal direction of blood flow towards the liver.

Other: None.
IMPRESSION: Stable rounded echogenic abnormality seen in right hepatic lobe, the
largest measuring 1.1 cm in diameter. These most likely represent
benign hemangiomas. No other abnormality seen in the right upper
quadrant of the abdomen.

## 2022-07-10 LAB — OB RESULTS CONSOLE GC/CHLAMYDIA
Chlamydia: NEGATIVE
Neisseria Gonorrhea: NEGATIVE

## 2022-07-10 LAB — OB RESULTS CONSOLE RPR: RPR: NONREACTIVE

## 2022-07-10 LAB — OB RESULTS CONSOLE RUBELLA ANTIBODY, IGM: Rubella: IMMUNE

## 2022-07-10 LAB — OB RESULTS CONSOLE HIV ANTIBODY (ROUTINE TESTING): HIV: NONREACTIVE

## 2022-07-10 LAB — HEPATITIS C ANTIBODY: HCV Ab: NEGATIVE

## 2022-07-10 LAB — OB RESULTS CONSOLE HEPATITIS B SURFACE ANTIGEN: Hepatitis B Surface Ag: NEGATIVE

## 2022-08-20 NOTE — L&D Delivery Note (Signed)
Patient was C/C/+2 and pushed for <1 minutes with epidural.    NSVD  female infant, Apgars 9/9, weight pending.   The patient had no laceration. Fundus was firm. EBL was expected amount. Placenta was delivered intact. Vagina was clear.  Delayed cord clamping done for 30-60 seconds while warming baby. Baby was vigorous and doing skin to skin with mother.  Philip Aspen

## 2022-11-26 ENCOUNTER — Telehealth: Payer: Self-pay | Admitting: Family Medicine

## 2022-11-26 NOTE — Telephone Encounter (Signed)
LVM for patient to call back and schedule

## 2022-11-26 NOTE — Telephone Encounter (Signed)
-----   Message from Donnamarie Poag, [place] sent at 11/26/2022  7:36 AM EDT ----- Please call patient to set up toc  ----- Message ----- From: Ardeth Sportsman Sent: 11/21/2022  10:09 AM EDT To: Shearon Stalls

## 2023-01-04 ENCOUNTER — Ambulatory Visit (INDEPENDENT_AMBULATORY_CARE_PROVIDER_SITE_OTHER): Admitting: Cardiology

## 2023-01-04 ENCOUNTER — Encounter: Payer: Self-pay | Admitting: Cardiology

## 2023-01-04 VITALS — BP 126/80 | HR 85 | Ht 67.0 in | Wt 158.9 lb

## 2023-01-04 DIAGNOSIS — R002 Palpitations: Secondary | ICD-10-CM | POA: Diagnosis not present

## 2023-01-04 DIAGNOSIS — R55 Syncope and collapse: Secondary | ICD-10-CM

## 2023-01-04 LAB — OB RESULTS CONSOLE GBS: GBS: NEGATIVE

## 2023-01-04 MED ORDER — PROPRANOLOL HCL 10 MG PO TABS: 10.0000 mg | ORAL_TABLET | Freq: Every evening | ORAL | 3 refills | Status: AC

## 2023-01-04 NOTE — Patient Instructions (Signed)
Medication Instructions:  Your physician has recommended you make the following change in your medication:  START: Propranolol 10 mg nightly *If you need a refill on your cardiac medications before your next appointment, please call your pharmacy*   Lab Work: None If you have labs (blood work) drawn today and your tests are completely normal, you will receive your results only by: MyChart Message (if you have MyChart) OR A paper copy in the mail If you have any lab test that is abnormal or we need to change your treatment, we will call you to review the results.   Testing/Procedures: Your physician has requested that you have an echocardiogram. Echocardiography is a painless test that uses sound waves to create images of your heart. It provides your doctor with information about the size and shape of your heart and how well your heart's chambers and valves are working. This procedure takes approximately one hour. There are no restrictions for this procedure. Please do NOT wear cologne, perfume, aftershave, or lotions (deodorant is allowed). Please arrive 15 minutes prior to your appointment time.    Follow-Up: At [facility], you and your health needs are our priority.  As part of our continuing mission to provide you with exceptional heart care, we have created designated Provider Care Teams.  These Care Teams include your primary Cardiologist (physician) and Advanced Practice Providers (APPs -  Physician Assistants and Nurse Practitioners) who all work together to provide you with the care you need, when you need it.   Your next appointment:   8 week(s)  Provider:   Thomasene Ripple, DO [address], [place], [place] [address]

## 2023-01-04 NOTE — Progress Notes (Signed)
Cardio-Obstetrics Clinic  New Evaluation  Date:  01/04/2023   ID:  Monica Bowers, DOB 10-09-1990, MRN [id number]  PCP:  Gweneth Dimitri, MD   [facility] Providers Cardiologist:  None  Electrophysiologist:  None       Referring MD: Lavina Hamman, MD   Chief Complaint: : I am   History of Present Illness:    Monica Bowers is a 32 y.o. female [G2P1001] who is being seen today for the evaluation of presyncope at the request of Meisinger, Tawanna Cooler, MD.   Medical hx includes IBS.   She is today to be evaluated increasing heart rate and syncope episodes. She has similar issue several years ago where her heart rate were so elevated and she felt dizzy and had some episodes of syncope. She has diagnostic testing. Holter and echo normal.  This time her HR is now in the 150 and higher. When this happens she feels fatigue and feels as if she is going to pass out. She is concern about this.    Prior CV Studies Reviewed: The following studies were reviewed today:   Past Medical History:  Diagnosis Date   Chronic constipation    History of chicken pox    IBS (irritable bowel syndrome)    Syncope     Past Surgical History:  Procedure Laterality Date   WISDOM TOOTH EXTRACTION        OB History     Gravida  2   Para  1   Term  1   Preterm      AB      Living  1      SAB      IAB      Ectopic      Multiple      Live Births  1               Current Medications: Current Meds  Medication Sig   famotidine (PEPCID) 10 MG tablet Take 10 mg by mouth 2 (two) times daily.   Multiple Vitamin (MULTIVITAMIN) tablet Take 1 tablet by mouth daily.   propranolol (INDERAL) 10 MG tablet Take 1 tablet (10 mg total) by mouth at bedtime.     Allergies:   Patient has no known allergies.   Social History   Socioeconomic History   Marital status: Married    Spouse name: Riki Rusk   Number of children: 1   Years of education:  Art gallery manager of [employer] education level: Not on file  Occupational History   Not on file  Tobacco Use   Smoking status: Never   Smokeless tobacco: Never  Vaping Use   Vaping Use: Never used  Substance and Sexual Activity   Alcohol use: Never   Drug use: Never   Sexual activity: Yes    Birth control/protection: Pill  Other Topics Concern   Not on file  Social History Narrative   08/12/19   From[place] over   Living: in [place] with husband    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       Family: Daughter - Pharmacist, community (2019), mom nearby      Enjoys: going to the movies, watching netflix, running      Exercise: running 2-3 times a week   Diet: trying to switch to more veggie based diet      Safety   Seat belts: Yes    Guns: No   Safe in relationships: Yes  Social Determinants of Health   Financial Resource Strain: Not on file  Food Insecurity: Not on file  Transportation Needs: Not on file  Physical Activity: Not on file  Stress: Not on file  Social Connections: Not on file      Family History  Problem Relation Age of Onset   Thyroid cancer Mother    Diabetes Mother    Hyperlipidemia Mother    Hypertension Mother    Miscarriages / [place] Mother    Asthma Father    Pancreatic cancer Father    Stroke Father    Diabetes Maternal Grandmother    Hypertension Maternal Grandmother    Hyperlipidemia Maternal Grandfather    Heart disease Maternal Grandfather    Heart attack Maternal Grandfather 29       in his 31s   Hearing loss Maternal Grandfather    Breast cancer Paternal Grandmother 60   Diabetes Paternal Grandmother    Hearing loss Paternal Grandmother    Kidney disease Paternal Grandmother    Heart disease Paternal Grandfather    Hyperlipidemia Paternal Grandfather       ROS:   Please see the history of present illness.    Palpiations and presyncope All other systems reviewed and are negative.   Labs/EKG Reviewed:     EKG:   EKG is was not ordered today.    Recent Labs: 02/16/2022: ALT 12; BUN 10; Creatinine, Ser 0.83; Hemoglobin 13.0; Platelets 295.0; Potassium 4.5; Sodium 140   Recent Lipid Panel Lab Results  Component Value Date/Time   CHOL 171 02/16/2022 08:25 AM   TRIG 87.0 02/16/2022 08:25 AM   HDL 52.00 02/16/2022 08:25 AM   CHOLHDL 3 02/16/2022 08:25 AM   LDLCALC 101 (H) 02/16/2022 08:25 AM    Physical Exam:    VS:  BP 126/80   Pulse 85   Ht 5\' 7"  (1.702 m)   Wt 158 lb 14.4 oz (72.1 kg)   SpO2 98%   BMI 24.89 kg/m     Wt Readings from Last 3 Encounters:  01/04/23 158 lb 14.4 oz (72.1 kg)  02/16/22 124 lb 4 oz (56.4 kg)  05/15/21 124 lb (56.2 kg)     GEN:  Well nourished, well developed in no acute distress HEENT: Normal NECK: No JVD; No carotid bruits LYMPHATICS: No lymphadenopathy CARDIAC: RRR, 2/6 murmurs, rubs, gallops RESPIRATORY:  Clear to auscultation without rales, wheezing or rhonchi  ABDOMEN: Soft, non-tender, non-distended MUSCULOSKELETAL:  No edema; No deformity  SKIN: Warm and dry NEUROLOGIC:  Alert and oriented x 3 PSYCHIATRIC:  Normal affect    Risk Assessment/Risk Calculators:     CARPREG II Risk Prediction Index Score:  1.  The patient's risk for a primary cardiac event is 5%.            ASSESSMENT & PLAN:    Syncope  Tachycardia - HR up to 150s   She is symptomatic with her heart rate going over 150 bpm - this is concerning. Will start low dose propanolol 10 mg at night and we will monitor.  Previous echo she tells me was normal - it will be beneficial with current symptoms to get a report echo.   Plan to fu with patient in 8 weeks.    Patient Instructions  Medication Instructions:  Your physician has recommended you make the following change in your medication:  START: Propranolol 10 mg nightly *If you need a refill on your cardiac medications before your next appointment, please call your pharmacy*  Lab Work: None If you have  labs (blood work) drawn today and your tests are completely normal, you will receive your results only by: MyChart Message (if you have MyChart) OR A paper copy in the mail If you have any lab test that is abnormal or we need to change your treatment, we will call you to review the results.   Testing/Procedures: Your physician has requested that you have an echocardiogram. Echocardiography is a painless test that uses sound waves to create images of your heart. It provides your doctor with information about the size and shape of your heart and how well your heart's chambers and valves are working. This procedure takes approximately one hour. There are no restrictions for this procedure. Please do NOT wear cologne, perfume, aftershave, or lotions (deodorant is allowed). Please arrive 15 minutes prior to your appointment time.    Follow-Up: At [facility], you and your health needs are our priority.  As part of our continuing mission to provide you with exceptional heart care, we have created designated Provider Care Teams.  These Care Teams include your primary Cardiologist (physician) and Advanced Practice Providers (APPs -  Physician Assistants and Nurse Practitioners) who all work together to provide you with the care you need, when you need it.   Your next appointment:   8 week(s)  Provider:   Thomasene Ripple, DO [address], [place], [place] [address]    Dispo:  No follow-ups on file.   Medication Adjustments/Labs and Tests Ordered: Current medicines are reviewed at length with the patient today.  Concerns regarding medicines are outlined above.  Tests Ordered: Orders Placed This Encounter  Procedures   ECHOCARDIOGRAM COMPLETE   Medication Changes: Meds ordered this encounter  Medications   propranolol (INDERAL) 10 MG tablet    Sig: Take 1 tablet (10 mg total) by mouth at bedtime.    Dispense:  90 tablet    Refill:  3

## 2023-01-09 ENCOUNTER — Ambulatory Visit (HOSPITAL_COMMUNITY): Attending: Cardiology

## 2023-01-09 DIAGNOSIS — R55 Syncope and collapse: Secondary | ICD-10-CM | POA: Diagnosis present

## 2023-01-09 LAB — ECHOCARDIOGRAM COMPLETE
Area-P 1/2: 4.4 cm2
S' Lateral: 3 cm

## 2023-01-24 ENCOUNTER — Inpatient Hospital Stay (HOSPITAL_COMMUNITY)
Admission: AD | Admit: 2023-01-24 | Discharge: 2023-01-24 | Disposition: A | Discharge status: Home / Self Care | Attending: Obstetrics and Gynecology | Admitting: Obstetrics and Gynecology

## 2023-01-24 ENCOUNTER — Inpatient Hospital Stay (HOSPITAL_BASED_OUTPATIENT_CLINIC_OR_DEPARTMENT_OTHER)

## 2023-01-24 ENCOUNTER — Encounter (HOSPITAL_COMMUNITY): Payer: Self-pay

## 2023-01-24 DIAGNOSIS — Z3A39 39 weeks gestation of pregnancy: Secondary | ICD-10-CM

## 2023-01-24 DIAGNOSIS — O368130 Decreased fetal movements, third trimester, not applicable or unspecified: Secondary | ICD-10-CM | POA: Insufficient documentation

## 2023-01-24 DIAGNOSIS — Z3689 Encounter for other specified antenatal screening: Secondary | ICD-10-CM

## 2023-01-24 DIAGNOSIS — O471 False labor at or after 37 completed weeks of gestation: Secondary | ICD-10-CM | POA: Insufficient documentation

## 2023-01-24 NOTE — MAU Note (Signed)
.  Monica Bowers is a 32 y.o. at [redacted]w[redacted]d here in MAU reporting:   Contractions every: 4-5 minutes Onset of ctx: 11pm last night Pain score: 3/10  ROM: intact Vaginal Bleeding: None Last SVE: 1.5 cm  Epidural: Planning  Fetal Movement: Reports decreased FM and last time she felt her baby move was 6-7 hours FHT:146 via External  Vitals:   01/24/23 0509  BP: 128/88  Pulse: 85  Resp: 19  Temp: 97.8 F (36.6 C)  SpO2: 100%       OB Office: [place] GBS: Negative HSV: Denies hx of HSV \

## 2023-01-24 NOTE — MAU Provider Note (Signed)
History     CSN: [id number]  Arrival date and time: 01/24/23 0451   Event Date/Time   First Provider Initiated Contact with Patient 01/24/23 [phone]      Chief Complaint  Patient presents with   Decreased Fetal Movement   Contractions   HPI [name] is a 32 y.o. year old G64P1002 female at 107w0d weeks gestation who presents to MAU reporting contractions every 4-5 mins since 2300. Last cervical exam she was 1.0 cm dilated. She has not felt FM since 2200. Denies VB. She receives [facility]; next appt is 01/25/2023. Her spouse is present and contributing to the history taking.     OB History     Gravida  3   Para  1   Term  1   Preterm      AB      Living  2      SAB      IAB      Ectopic      Multiple      Live Births  2           Past Medical History:  Diagnosis Date   Chronic constipation    History of chicken pox    IBS (irritable bowel syndrome)    Syncope     Past Surgical History:  Procedure Laterality Date   WISDOM TOOTH EXTRACTION      Family History  Problem Relation Age of Onset   Thyroid cancer Mother    Diabetes Mother    Hyperlipidemia Mother    Hypertension Mother    Miscarriages / Stillbirths Mother    Asthma Father    Pancreatic cancer Father    Stroke Father    Diabetes Maternal Grandmother    Hypertension Maternal Grandmother    Hyperlipidemia Maternal Grandfather    Heart disease Maternal Grandfather    Heart attack Maternal Grandfather 84       in his 73s   Hearing loss Maternal Grandfather    Breast cancer Paternal Grandmother 52   Diabetes Paternal Grandmother    Hearing loss Paternal Grandmother    Kidney disease Paternal Grandmother    Heart disease Paternal Grandfather    Hyperlipidemia Paternal Grandfather     Social History   Tobacco Use   Smoking status: Never   Smokeless tobacco: Never  Vaping Use   Vaping Use: Never used  Substance Use Topics   Alcohol use:  Never   Drug use: Never    Allergies: No Known Allergies  Medications Prior to Admission  Medication Sig Dispense Refill Last Dose   famotidine (PEPCID) 10 MG tablet Take 10 mg by mouth 2 (two) times daily.   01/23/2023   propranolol (INDERAL) 10 MG tablet Take 1 tablet (10 mg total) by mouth at bedtime. 90 tablet 3 01/23/2023   Multiple Vitamin (MULTIVITAMIN) tablet Take 1 tablet by mouth daily.       Review of Systems  Constitutional: Negative.   HENT: Negative.    Eyes: Negative.   Respiratory: Negative.    Cardiovascular: Negative.   Gastrointestinal: Negative.   Endocrine: Negative.   Genitourinary:        DFM since 2200 last night  Musculoskeletal: Negative.   Skin: Negative.   Allergic/Immunologic: Negative.   Neurological: Negative.   Hematological: Negative.   Psychiatric/Behavioral: Negative.     Physical Exam   Blood pressure 128/88, pulse 85, temperature 97.8 F (36.6 C), temperature source Oral, resp. rate 19,  height 5\' 7"  (1.702 m), weight 75 kg, last menstrual period 02/13/2022, SpO2 100 %.  Physical Exam Vitals and nursing note reviewed.  Constitutional:      Appearance: Normal appearance. She is normal weight.  Cardiovascular:     Rate and Rhythm: Normal rate.  Pulmonary:     Effort: Pulmonary effort is normal.  Genitourinary:    Comments: Cervical exam by RN x 2 Musculoskeletal:        General: Normal range of motion.  Skin:    General: Skin is warm and dry.  Neurological:     Mental Status: She is alert and oriented to person, place, and time.  Psychiatric:        Mood and Affect: Mood normal.        Behavior: Behavior normal.        Thought Content: Thought content normal.        Judgment: Judgment normal.    Dilation: 1.5 Exam by:: P Onalee Hua RN  Cervix Unchanged after 1 hour  REACTIVE NST - FHR: 135 bpm / moderate variability / accels present / decels absent / TOCO: regular every 4-5 mins  Reassessment @ 0600: Patient feeling (+) FM  after returning from U/S  MAU Course  Procedures  MDM BPP  Assessment and Plan  1. NST (non-stress test) reactive - Information provided on labor precautions and the 2/3-1-1 Rule: Return to MAU for painful contractions every 2-3 minutes, lasting 1 minute each for 1.5 hours.   2. [redacted] weeks gestation of pregnancy   - Discharge patient - Keep scheduled appt with [facility] on 01/25/2023 - Patient verbalized an understanding of the plan of care and agrees.    Raelyn Mora, CNM 01/24/2023, 5:14 AM

## 2023-01-24 NOTE — Discharge Instructions (Signed)
2/3-1-1 Rule Go to MAU for painful contractions every 2-3 minutes, lasting 1 minute each for 1.5 hours.  

## 2023-01-28 ENCOUNTER — Inpatient Hospital Stay (HOSPITAL_COMMUNITY): Admitting: Anesthesiology

## 2023-01-28 ENCOUNTER — Encounter (HOSPITAL_COMMUNITY): Payer: Self-pay | Admitting: Obstetrics and Gynecology

## 2023-01-28 ENCOUNTER — Other Ambulatory Visit: Payer: Self-pay

## 2023-01-28 ENCOUNTER — Inpatient Hospital Stay (HOSPITAL_COMMUNITY)
Admission: AD | Admit: 2023-01-28 | Discharge: 2023-01-29 | DRG: 807 | Disposition: A | Discharge status: Home / Self Care | Attending: Obstetrics and Gynecology | Admitting: Obstetrics and Gynecology

## 2023-01-28 DIAGNOSIS — K589 Irritable bowel syndrome without diarrhea: Secondary | ICD-10-CM | POA: Diagnosis present

## 2023-01-28 DIAGNOSIS — R002 Palpitations: Secondary | ICD-10-CM | POA: Diagnosis present

## 2023-01-28 DIAGNOSIS — O9942 Diseases of the circulatory system complicating childbirth: Secondary | ICD-10-CM | POA: Diagnosis present

## 2023-01-28 DIAGNOSIS — Z3A39 39 weeks gestation of pregnancy: Secondary | ICD-10-CM

## 2023-01-28 DIAGNOSIS — O9962 Diseases of the digestive system complicating childbirth: Secondary | ICD-10-CM | POA: Diagnosis present

## 2023-01-28 DIAGNOSIS — O26893 Other specified pregnancy related conditions, third trimester: Secondary | ICD-10-CM | POA: Diagnosis present

## 2023-01-28 HISTORY — DX: Unspecified abnormal cytological findings in specimens from vagina: R87.629

## 2023-01-28 HISTORY — DX: Cardiac murmur, unspecified: R01.1

## 2023-01-28 LAB — CBC
HCT: 36.4 % (ref 36.0–46.0)
Hemoglobin: 11.5 g/dL — ABNORMAL LOW (ref 12.0–15.0)
MCH: 28.9 pg (ref 26.0–34.0)
MCHC: 31.6 g/dL (ref 30.0–36.0)
MCV: 91.5 fL (ref 80.0–100.0)
Platelets: 308 10*3/uL (ref 150–400)
RBC: 3.98 MIL/uL (ref 3.87–5.11)
RDW: 15.1 % (ref 11.5–15.5)
WBC: 12.3 10*3/uL — ABNORMAL HIGH (ref 4.0–10.5)
nRBC: 0 % (ref 0.0–0.2)

## 2023-01-28 LAB — TYPE AND SCREEN
ABO/RH(D): A POS
Antibody Screen: NEGATIVE

## 2023-01-28 LAB — RPR: RPR Ser Ql: NONREACTIVE

## 2023-01-28 MED ORDER — LIDOCAINE HCL (PF) 1 % IJ SOLN
INTRAMUSCULAR | Status: DC | PRN
  Administered 2023-01-28: 3 mL via EPIDURAL
  Administered 2023-01-28: 2 mL via EPIDURAL
  Administered 2023-01-28: 5 mL via EPIDURAL

## 2023-01-28 MED ORDER — SOD CITRATE-CITRIC ACID 500-334 MG/5ML PO SOLN: 30.0000 mL | ORAL | Status: DC | PRN

## 2023-01-28 MED ORDER — ACETAMINOPHEN 325 MG PO TABS: 650.0000 mg | ORAL_TABLET | ORAL | Status: DC | PRN

## 2023-01-28 MED ORDER — OXYCODONE HCL 5 MG PO TABS: 5.0000 mg | ORAL_TABLET | ORAL | Status: DC | PRN

## 2023-01-28 MED ORDER — DIPHENHYDRAMINE HCL 25 MG PO CAPS: 25.0000 mg | ORAL_CAPSULE | Freq: Four times a day (QID) | ORAL | Status: DC | PRN

## 2023-01-28 MED ORDER — SENNOSIDES-DOCUSATE SODIUM 8.6-50 MG PO TABS
2.0000 | ORAL_TABLET | Freq: Every day | ORAL | Status: DC
  Administered 2023-01-29: 2 via ORAL
  Filled 2023-01-28: qty 2

## 2023-01-28 MED ORDER — PHENYLEPHRINE 80 MCG/ML (10ML) SYRINGE FOR IV PUSH (FOR BLOOD PRESSURE SUPPORT): 80.0000 ug | PREFILLED_SYRINGE | INTRAVENOUS | Status: DC | PRN

## 2023-01-28 MED ORDER — EPHEDRINE 5 MG/ML INJ: 10.0000 mg | INTRAVENOUS | Status: DC | PRN

## 2023-01-28 MED ORDER — FENTANYL-BUPIVACAINE-NACL 0.5-0.125-0.9 MG/250ML-% EP SOLN
12.0000 mL/h | EPIDURAL | Status: DC | PRN
  Administered 2023-01-28: 12 mL/h via EPIDURAL
  Filled 2023-01-28: qty 250

## 2023-01-28 MED ORDER — ONDANSETRON HCL 4 MG/2ML IJ SOLN: 4.0000 mg | INTRAMUSCULAR | Status: DC | PRN

## 2023-01-28 MED ORDER — ZOLPIDEM TARTRATE 5 MG PO TABS: 5.0000 mg | ORAL_TABLET | Freq: Every evening | ORAL | Status: DC | PRN

## 2023-01-28 MED ORDER — ONDANSETRON HCL 4 MG PO TABS: 4.0000 mg | ORAL_TABLET | ORAL | Status: DC | PRN

## 2023-01-28 MED ORDER — SIMETHICONE 80 MG PO CHEW: 80.0000 mg | CHEWABLE_TABLET | ORAL | Status: DC | PRN

## 2023-01-28 MED ORDER — LACTATED RINGERS IV SOLN: INTRAVENOUS | Status: DC

## 2023-01-28 MED ORDER — OXYCODONE HCL 5 MG PO TABS: 10.0000 mg | ORAL_TABLET | ORAL | Status: DC | PRN

## 2023-01-28 MED ORDER — DIBUCAINE (PERIANAL) 1 % EX OINT: 1.0000 | TOPICAL_OINTMENT | CUTANEOUS | Status: DC | PRN

## 2023-01-28 MED ORDER — OXYCODONE-ACETAMINOPHEN 5-325 MG PO TABS: 1.0000 | ORAL_TABLET | ORAL | Status: DC | PRN

## 2023-01-28 MED ORDER — OXYCODONE-ACETAMINOPHEN 5-325 MG PO TABS: 2.0000 | ORAL_TABLET | ORAL | Status: DC | PRN

## 2023-01-28 MED ORDER — OXYTOCIN-SODIUM CHLORIDE 30-0.9 UT/500ML-% IV SOLN
2.5000 [IU]/h | INTRAVENOUS | Status: DC
  Administered 2023-01-28: 2.5 [IU]/h via INTRAVENOUS
  Filled 2023-01-28: qty 500

## 2023-01-28 MED ORDER — TETANUS-DIPHTH-ACELL PERTUSSIS 5-2.5-18.5 LF-MCG/0.5 IM SUSY: 0.5000 mL | PREFILLED_SYRINGE | Freq: Once | INTRAMUSCULAR | Status: DC

## 2023-01-28 MED ORDER — PRENATAL MULTIVITAMIN CH
1.0000 | ORAL_TABLET | Freq: Every day | ORAL | Status: DC
  Administered 2023-01-29: 1 via ORAL
  Filled 2023-01-28: qty 1

## 2023-01-28 MED ORDER — ONDANSETRON HCL 4 MG/2ML IJ SOLN: 4.0000 mg | Freq: Four times a day (QID) | INTRAMUSCULAR | Status: DC | PRN

## 2023-01-28 MED ORDER — LACTATED RINGERS IV SOLN: 500.0000 mL | INTRAVENOUS | Status: DC | PRN

## 2023-01-28 MED ORDER — BENZOCAINE-MENTHOL 20-0.5 % EX AERO: 1.0000 | INHALATION_SPRAY | CUTANEOUS | Status: DC | PRN

## 2023-01-28 MED ORDER — LIDOCAINE HCL (PF) 1 % IJ SOLN: 30.0000 mL | INTRAMUSCULAR | Status: DC | PRN

## 2023-01-28 MED ORDER — WITCH HAZEL-GLYCERIN EX PADS: 1.0000 | MEDICATED_PAD | CUTANEOUS | Status: DC | PRN

## 2023-01-28 MED ORDER — DIPHENHYDRAMINE HCL 50 MG/ML IJ SOLN: 12.5000 mg | INTRAMUSCULAR | Status: DC | PRN

## 2023-01-28 MED ORDER — COCONUT OIL OIL: 1.0000 | TOPICAL_OIL | Status: DC | PRN

## 2023-01-28 MED ORDER — PHENYLEPHRINE 80 MCG/ML (10ML) SYRINGE FOR IV PUSH (FOR BLOOD PRESSURE SUPPORT)
80.0000 ug | PREFILLED_SYRINGE | INTRAVENOUS | Status: DC | PRN
  Filled 2023-01-28: qty 10

## 2023-01-28 MED ORDER — IBUPROFEN 600 MG PO TABS
600.0000 mg | ORAL_TABLET | Freq: Four times a day (QID) | ORAL | Status: DC
  Administered 2023-01-29 (×3): 600 mg via ORAL
  Filled 2023-01-28 (×3): qty 1

## 2023-01-28 MED ORDER — OXYTOCIN BOLUS FROM INFUSION
333.0000 mL | Freq: Once | INTRAVENOUS | Status: AC
  Administered 2023-01-28: 333 mL via INTRAVENOUS

## 2023-01-28 MED ORDER — LACTATED RINGERS IV SOLN: 500.0000 mL | Freq: Once | INTRAVENOUS | Status: DC

## 2023-01-28 MED ORDER — FLEET ENEMA 7-19 GM/118ML RE ENEM: 1.0000 | ENEMA | RECTAL | Status: DC | PRN

## 2023-01-28 NOTE — Progress Notes (Signed)
Pt states she "feels like she has with previous deliveries" and requests to be checked

## 2023-01-28 NOTE — H&P (Signed)
32 y.o. [redacted]w[redacted]d  G3P2002 comes in c/o intensifying contractions overnight.  Otherwise has good fetal movement and no bleeding.  Past Medical History:  Diagnosis Date   Chronic constipation    Heart murmur    normal echo   History of chicken pox    IBS (irritable bowel syndrome)    Syncope    Vaginal Pap smear, abnormal    HPV    Past Surgical History:  Procedure Laterality Date   NASAL SEPTUM SURGERY  2011   WISDOM TOOTH EXTRACTION      OB History  Gravida Para Term Preterm AB Living  3 2 2     2   SAB IAB Ectopic Multiple Live Births          2    # Outcome Date GA Lbr Len/2nd Weight Sex Delivery Anes PTL Lv  3 Current           2 Term 11/04/20 [redacted]w[redacted]d   F Vag-Spont   LIV  1 Term 06/02/18    F Vag-Spont   LIV    Social History   Socioeconomic History   Marital status: Married    Spouse name: Riki Rusk   Number of children: 1   Years of education: Art gallery manager of [employer] education level: Not on file  Occupational History   Not on file  Tobacco Use   Smoking status: Never   Smokeless tobacco: Never  Vaping Use   Vaping Use: Never used  Substance and Sexual Activity   Alcohol use: Not Currently    Comment: rare   Drug use: Never   Sexual activity: Yes    Birth control/protection: Pill  Other Topics Concern   Not on file  Social History Narrative   08/12/19   From[place] over   Living: in [place] with husband    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       Family: Daughter - Pharmacist, community (2019), mom nearby      Enjoys: going to the movies, watching netflix, running      Exercise: running 2-3 times a week   Diet: trying to switch to more veggie based diet      Safety   Seat belts: Yes    Guns: No   Safe in relationships: Yes    Social Determinants of Corporate investment banker Strain: Not on file  Food Insecurity: Not on file  Transportation Needs: Not on file  Physical Activity: Not on file  Stress: Not on file  Social  Connections: Not on file  Intimate Partner Violence: Not on file   Tea tree oil    Prenatal Transfer Tool  Maternal Diabetes: No Genetic Screening: Normal Maternal Ultrasounds/Referrals: Isolated choroid plexus cyst, which resolved on follow yp scan Fetal Ultrasounds or other Referrals:  None Maternal Substance Abuse:  No Significant Maternal Medications:  None Significant Maternal Lab Results: Group B Strep negative  Other PNC: uncomplicated.    Vitals:   01/28/23 0831 01/28/23 0845 01/28/23 0848  BP:   124/85  Pulse:   (!) 109  Resp:   18  Temp:   98 F (36.7 C)  TempSrc:   Oral  SpO2:  98%   Weight: 74.4 kg    Height: 5\' 7"  (1.702 m)      Lungs/Cor:  normal HR Abdomen:  gravid, nontender Ex:  no calf tenderness SVE:  4/90/-1 FHTs:  140, good STV, NST R; Cat 1 tracing. Toco:  q  2-6   A/P   Admit to L&D with labor  GBS Neg  Epidural upon request  Anticipate AROM and vaginal delivery  Other routine  care  Philip Aspen

## 2023-01-28 NOTE — Lactation Note (Signed)
This note was copied from a baby's chart. Lactation Consultation Note  Patient Name: Monica Bowers [id number] Date: 01/28/2023 Age:32 hours Reason for consult: Initial assessment;Term Experienced BF mom stated the baby has been BF great as her last child did. Mom couldn't BF her 1st child because she wouldn't latch. Mom had not trouble latching her 2nd child nor this child. Newborn feeding habits, STS, I&O reviewed. Mom encouraged to feed baby 8-12 times/24 hours and with feeding cues.  Encouraged mom is she needs assistance or has questions to call and I was putting mom PRN. Mom stated that was fine she felt like she didn't need any help right now.   Maternal Data Does the patient have breastfeeding experience prior to this delivery?: Yes How long did the patient breastfeed?: 1 1/2 yrs  Feeding    LATCH Score                    Lactation Tools Discussed/Used    Interventions    Discharge    Consult Status Consult Status: PRN    Charyl Dancer 01/28/2023, 9:21 PM

## 2023-01-28 NOTE — Progress Notes (Signed)
Pt c/o feeling dizzy.  BP taken, position changed

## 2023-01-28 NOTE — Progress Notes (Signed)
Patient comfortable with epidural FHT: 135 mod var +accels no decels TOCO: q2-5 SVE: 4/80/0 A/P: AROM clear Monitor and add pitocin as needed Other routine care.

## 2023-01-28 NOTE — Anesthesia Procedure Notes (Signed)
Epidural Patient location during procedure: OB Start time: 01/28/2023 10:30 AM End time: 01/28/2023 10:37 AM  Staffing Anesthesiologist: Marcene Duos, MD Performed: anesthesiologist   Preanesthetic Checklist Completed: patient identified, IV checked, site marked, risks and benefits discussed, surgical consent, monitors and equipment checked, pre-op evaluation and timeout performed  Epidural Patient position: sitting Prep: DuraPrep and site prepped and draped Patient monitoring: continuous pulse ox and blood pressure Approach: midline Location: L5-S1 Injection technique: LOR air  Needle:  Needle type: Tuohy  Needle gauge: 17 G Needle length: 9 cm and 9 Needle insertion depth: 4.5 cm Catheter type: closed end flexible Catheter size: 19 Gauge Test dose: negative  Assessment Events: blood not aspirated, no cerebrospinal fluid, injection not painful, no injection resistance, paresthesia (Brief paresthesia at L3/4 and L4/5. Transient and resolved with removal of needle. No paresthesia on final L5/S1 pass and all sx resolved.) and negative IV test

## 2023-01-28 NOTE — Progress Notes (Signed)
Filed under wrong column

## 2023-01-28 NOTE — Plan of Care (Signed)
Pt demonstrated understanding 

## 2023-01-28 NOTE — Anesthesia Preprocedure Evaluation (Addendum)
Anesthesia Evaluation  Patient identified by MRN, date of birth, ID band Patient awake    Reviewed: Allergy & Precautions, Patient's Chart, lab work & pertinent test results  Airway Mallampati: II  TM Distance: >3 FB     Dental   Pulmonary neg pulmonary ROS   Pulmonary exam normal        Cardiovascular Normal cardiovascular exam+ dysrhythmias (presyncope during pregnancy associated with tachycardia)      Neuro/Psych negative neurological ROS     GI/Hepatic negative GI ROS, Neg liver ROS,,,  Endo/Other  negative endocrine ROS    Renal/GU negative Renal ROS     Musculoskeletal   Abdominal   Peds  Hematology negative hematology ROS (+)   Anesthesia Other Findings   Reproductive/Obstetrics (+) Pregnancy                             Anesthesia Physical Anesthesia Plan  ASA: 2  Anesthesia Plan: Epidural   Post-op Pain Management:    Induction:   PONV Risk Score and Plan: 2  Airway Management Planned: Natural Airway  Additional Equipment:   Intra-op Plan:   Post-operative Plan:   Informed Consent: I have reviewed the patients History and Physical, chart, labs and discussed the procedure including the risks, benefits and alternatives for the proposed anesthesia with the patient or authorized representative who has indicated his/her understanding and acceptance.       Plan Discussed with:   Anesthesia Plan Comments:        Anesthesia Quick Evaluation

## 2023-01-29 LAB — CBC
HCT: 33.4 % — ABNORMAL LOW (ref 36.0–46.0)
Hemoglobin: 10.7 g/dL — ABNORMAL LOW (ref 12.0–15.0)
MCH: 30.1 pg (ref 26.0–34.0)
MCHC: 32 g/dL (ref 30.0–36.0)
MCV: 94.1 fL (ref 80.0–100.0)
Platelets: 236 10*3/uL (ref 150–400)
RBC: 3.55 MIL/uL — ABNORMAL LOW (ref 3.87–5.11)
RDW: 15.2 % (ref 11.5–15.5)
WBC: 10.3 10*3/uL (ref 4.0–10.5)
nRBC: 0 % (ref 0.0–0.2)

## 2023-01-29 MED ORDER — IBUPROFEN 800 MG PO TABS: 800.0000 mg | ORAL_TABLET | Freq: Three times a day (TID) | ORAL | 1 refills | Status: AC | PRN

## 2023-01-29 NOTE — Discharge Summary (Signed)
Postpartum Discharge Summary  Date of Service updated     Patient Name: Monica Bowers DOB: [birth year] MRN: [id number]  Date of admission: 01/28/2023 Delivery date:01/28/2023  Delivering provider: Philip Aspen  Date of discharge: 01/29/2023  Admitting diagnosis: Normal labor [O80, Z37.9] Intrauterine pregnancy: [redacted]w[redacted]d     Secondary diagnosis:  Principal Problem:   Normal labor  Additional problems: none    Discharge diagnosis: Term Pregnancy Delivered                                              Post partum procedures: none Augmentation: AROM Complications: None  Hospital course: Onset of Labor With Vaginal Delivery      32 y.o. yo G3P3003 at [redacted]w[redacted]d was admitted in Active Labor on 01/28/2023. Labor course was complicated bynone  Membrane Rupture Time/Date: 11:20 AM ,01/28/2023   Delivery Method:Vaginal, Spontaneous  Episiotomy: None  Lacerations:  None  Patient had a postpartum course complicated by none.  She is ambulating, tolerating a regular diet, passing flatus, and urinating well. Patient is discharged home in stable condition on 01/29/23.  Newborn Data: Birth date:01/28/2023  Birth time:3:51 PM  Gender:Female  Living status:Living  Apgars:9 ,9  Weight:2950 g   Physical exam  Vitals:   01/28/23 1753 01/28/23 1949 01/29/23 0001 01/29/23 0411  BP: 122/83 121/81 106/68 115/70  Pulse: 67 84 84 72  Resp: 18 18 18 18   Temp: 98.2 F (36.8 C) 98 F (36.7 C) 98.2 F (36.8 C) 98.3 F (36.8 C)  TempSrc: Oral Oral Oral Oral  SpO2: 100% 97% 96% 97%  Weight:      Height:       General: alert, cooperative, and no distress Lochia: appropriate Uterine Fundus: firm Incision: N/A DVT Evaluation: No evidence of DVT seen on physical exam. Negative Homan's sign. No cords or calf tenderness. Labs: Lab Results  Component Value Date   WBC 10.3 01/29/2023   HGB 10.7 (L) 01/29/2023   HCT 33.4 (L) 01/29/2023   MCV 94.1 01/29/2023   PLT 236 01/29/2023       Latest Ref Rng & Units 02/16/2022    8:25 AM  CMP  Glucose 70 - 99 mg/dL 84   BUN 6 - 23 mg/dL 10   Creatinine 4.09 - 1.20 mg/dL 8.11   Sodium 914 - 782 mEq/L 140   Potassium 3.5 - 5.1 mEq/L 4.5   Chloride 96 - 112 mEq/L 106   CO2 19 - 32 mEq/L 29   Calcium 8.4 - 10.5 mg/dL 9.5   Total Protein 6.0 - 8.3 g/dL 7.4   Total Bilirubin 0.2 - 1.2 mg/dL 1.1   Alkaline Phos 39 - 117 U/L 77   AST 0 - 37 U/L 15   ALT 0 - 35 U/L 12    Edinburgh Score:    01/29/2023    9:11 AM  Edinburgh Postnatal Depression Scale Screening Tool  I have been able to laugh and see the funny side of things. 0  I have looked forward with enjoyment to things. 0  I have blamed myself unnecessarily when things went wrong. 0  I have been anxious or worried for no good reason. 0  I have felt scared or panicky for no good reason. 0  Things have been getting on top of me. 1  I have been so unhappy that I have had  difficulty sleeping. 0  I have felt sad or miserable. 0  I have been so unhappy that I have been crying. 0  The thought of harming myself has occurred to me. 0  Edinburgh Postnatal Depression Scale Total 1      After visit meds:  Allergies as of 01/29/2023       Reactions   Tea Tree Oil Rash        Medication List     STOP taking these medications    calcium carbonate 750 MG chewable tablet Commonly known as: TUMS EX   famotidine 10 MG tablet Commonly known as: PEPCID       TAKE these medications    ibuprofen 800 MG tablet Commonly known as: ADVIL Take 1 tablet (800 mg total) by mouth every 8 (eight) hours as needed.   multivitamin tablet Take 1 tablet by mouth daily.   propranolol 10 MG tablet Commonly known as: INDERAL Take 1 tablet (10 mg total) by mouth at bedtime.         Discharge home in stable condition Infant Feeding: Breast Infant Disposition:home with mother Discharge instruction: per After Visit Summary and Postpartum booklet. Activity: Advance as  tolerated. Pelvic rest for 6 weeks.  Diet: routine diet Anticipated Birth Control: Unsure Postpartum Appointment:6 weeks Future Appointments: Future Appointments  Date Time Provider Department Center  03/08/2023  3:40 PM Thomasene Ripple, DO [facility] None   Follow up Visit: [place] [facility]    01/29/2023 Carlisle Cater, MD

## 2023-01-29 NOTE — Progress Notes (Signed)
POSTPARTUM PROGRESS NOTE  Post Partum Day #1  Subjective:  No acute events overnight.  Pt denies problems with ambulating, voiding or po intake.  She denies naubowel movement.  Lochia Minimal.   Objective: Blood pressure 115/70, pulse 72, temperature 98.3 F (36.8 C), temperature source Oral, resp. rate 18, height 5\' 7"  (1.702 m), weight 74.4 kg, last menstrual period 02/13/2022, SpO2 97 %, unknown if currently breastfeeding.  Physical Exam:  General: alert, cooperative and no distress Lochia:normal flow Chest: CTAB Heart: RRR no m/r/g Abdomen: +BS, soft, nontender Uterine Fundus: firm, 2cm below umbilicus Extremities: neg edema, neg calf TTP BL, neg Homans BL  Recent Labs    01/28/23 0919 01/29/23 0659  HGB 11.5* 10.7*  HCT 36.4 33.4*    Assessment/Plan:  ASSESSMENT: Monica Bowers is a 32 y.o. G3P3003 s/p SVD @ [redacted]w[redacted]d. PNC c/b IBS, palpitations on propranolol.   Discharge home and Breastfeeding Advised meeting pp milestones, ok for discharge, this may be held 2/2 baby weight as deemed necc by Peds   LOS: 1 day

## 2023-01-29 NOTE — Anesthesia Postprocedure Evaluation (Signed)
Anesthesia Post Note  Patient: Monica Bowers  Procedure(s) Performed: AN AD HOC LABOR EPIDURAL     Patient location during evaluation: Mother Baby Anesthesia Type: Epidural Level of consciousness: awake and alert Pain management: pain level controlled Vital Signs Assessment: post-procedure vital signs reviewed and stable Respiratory status: spontaneous breathing, nonlabored ventilation and respiratory function stable Cardiovascular status: stable Postop Assessment: no headache, no backache and epidural receding Anesthetic complications: no   No notable events documented.  Last Vitals:  Vitals:   01/29/23 0001 01/29/23 0411  BP: 106/68 115/70  Pulse: 84 72  Resp: 18 18  Temp: 36.8 C 36.8 C  SpO2: 96% 97%    Last Pain:  Vitals:   01/29/23 0506  TempSrc:   PainSc: 3    Pain Goal:                   Rica Records

## 2023-02-07 ENCOUNTER — Inpatient Hospital Stay (HOSPITAL_COMMUNITY)

## 2023-02-07 ENCOUNTER — Inpatient Hospital Stay (HOSPITAL_COMMUNITY): Admission: AD | Admit: 2023-02-07 | Source: Home / Self Care | Admitting: Obstetrics and Gynecology

## 2023-02-16 ENCOUNTER — Telehealth (HOSPITAL_COMMUNITY): Payer: Self-pay

## 2023-02-16 NOTE — Telephone Encounter (Signed)
02/16/2023 1553  Name: Sausha Zappia MRN: [id number] DOB: [birth year]  Reason for Call:  Transition of Care Hospital Discharge Call  Contact Status: Patient Contact Status: Message  Language assistant needed: Interpreter Mode: Interpreter Not Needed        Follow-Up Questions:    Inocente Salles Postnatal Depression Scale:  In the Past 7 Days:    PHQ2-9 Depression Scale:     Discharge Follow-up:    Post-discharge interventions: NA  Signature Signe Colt

## 2023-03-08 ENCOUNTER — Ambulatory Visit: Admitting: Cardiology
